# Patient Record
Sex: Female | Born: 1945
Health system: Southern US, Community
[De-identification: ages and names within clinical notes are randomized; demographics above are authoritative.]

## PROBLEM LIST (undated history)

## (undated) DIAGNOSIS — F419 Anxiety disorder, unspecified: Secondary | ICD-10-CM

## (undated) DIAGNOSIS — M8580 Other specified disorders of bone density and structure, unspecified site: Secondary | ICD-10-CM

## (undated) DIAGNOSIS — N390 Urinary tract infection, site not specified: Secondary | ICD-10-CM

## (undated) DIAGNOSIS — E559 Vitamin D deficiency, unspecified: Secondary | ICD-10-CM

## (undated) DIAGNOSIS — M1990 Unspecified osteoarthritis, unspecified site: Secondary | ICD-10-CM

## (undated) DIAGNOSIS — G8929 Other chronic pain: Secondary | ICD-10-CM

## (undated) DIAGNOSIS — I341 Nonrheumatic mitral (valve) prolapse: Secondary | ICD-10-CM

## (undated) DIAGNOSIS — K219 Gastro-esophageal reflux disease without esophagitis: Secondary | ICD-10-CM

## (undated) DIAGNOSIS — R319 Hematuria, unspecified: Secondary | ICD-10-CM

## (undated) DIAGNOSIS — M549 Dorsalgia, unspecified: Secondary | ICD-10-CM

## (undated) DIAGNOSIS — I10 Essential (primary) hypertension: Secondary | ICD-10-CM

## (undated) DIAGNOSIS — E7800 Pure hypercholesterolemia, unspecified: Secondary | ICD-10-CM

## (undated) HISTORY — DX: Other specified disorders of bone density and structure, unspecified site: M85.80

## (undated) HISTORY — DX: Anxiety disorder, unspecified: F41.9

## (undated) HISTORY — PX: KNEE SURGERY: SHX244

## (undated) HISTORY — DX: Gastro-esophageal reflux disease without esophagitis: K21.9

## (undated) HISTORY — DX: Dorsalgia, unspecified: M54.9

## (undated) HISTORY — PX: COLONOSCOPY: SHX174

## (undated) HISTORY — DX: Nonrheumatic mitral (valve) prolapse: I34.1

## (undated) HISTORY — PX: TUBAL LIGATION: SHX77

## (undated) HISTORY — DX: Other chronic pain: G89.29

## (undated) HISTORY — DX: Essential (primary) hypertension: I10

## (undated) HISTORY — DX: Hematuria, unspecified: R31.9

## (undated) HISTORY — DX: Unspecified osteoarthritis, unspecified site: M19.90

## (undated) HISTORY — DX: Vitamin D deficiency, unspecified: E55.9

## (undated) HISTORY — DX: Urinary tract infection, site not specified: N39.0

## (undated) HISTORY — DX: Pure hypercholesterolemia, unspecified: E78.00

## (undated) HISTORY — PX: APPENDECTOMY: SHX54

---

## 1998-05-10 ENCOUNTER — Ambulatory Visit (HOSPITAL_COMMUNITY): Admission: RE | Admit: 1998-05-10 | Discharge: 1998-05-10 | Payer: Self-pay | Admitting: *Deleted

## 1998-05-10 ENCOUNTER — Encounter: Payer: Self-pay | Admitting: *Deleted

## 1998-05-20 ENCOUNTER — Other Ambulatory Visit: Admission: RE | Admit: 1998-05-20 | Discharge: 1998-05-20 | Payer: Self-pay | Admitting: Obstetrics and Gynecology

## 1999-05-23 ENCOUNTER — Encounter: Admission: RE | Admit: 1999-05-23 | Discharge: 1999-05-23 | Payer: Self-pay | Admitting: *Deleted

## 1999-05-23 ENCOUNTER — Encounter: Payer: Self-pay | Admitting: *Deleted

## 1999-05-26 ENCOUNTER — Other Ambulatory Visit: Admission: RE | Admit: 1999-05-26 | Discharge: 1999-05-26 | Payer: Self-pay | Admitting: Obstetrics and Gynecology

## 2000-05-28 ENCOUNTER — Encounter: Admission: RE | Admit: 2000-05-28 | Discharge: 2000-05-28 | Payer: Self-pay | Admitting: *Deleted

## 2000-05-28 ENCOUNTER — Encounter: Payer: Self-pay | Admitting: *Deleted

## 2000-06-07 ENCOUNTER — Other Ambulatory Visit: Admission: RE | Admit: 2000-06-07 | Discharge: 2000-06-07 | Payer: Self-pay | Admitting: Obstetrics and Gynecology

## 2001-04-21 ENCOUNTER — Ambulatory Visit (HOSPITAL_COMMUNITY): Admission: RE | Admit: 2001-04-21 | Discharge: 2001-04-21 | Payer: Self-pay | Admitting: Family Medicine

## 2001-04-21 ENCOUNTER — Encounter: Payer: Self-pay | Admitting: Family Medicine

## 2001-06-28 ENCOUNTER — Encounter: Payer: Self-pay | Admitting: Obstetrics and Gynecology

## 2001-06-28 ENCOUNTER — Encounter: Admission: RE | Admit: 2001-06-28 | Discharge: 2001-06-28 | Payer: Self-pay | Admitting: Obstetrics and Gynecology

## 2001-07-04 ENCOUNTER — Other Ambulatory Visit: Admission: RE | Admit: 2001-07-04 | Discharge: 2001-07-04 | Payer: Self-pay | Admitting: Obstetrics and Gynecology

## 2002-07-17 ENCOUNTER — Encounter: Payer: Self-pay | Admitting: Obstetrics and Gynecology

## 2002-07-17 ENCOUNTER — Encounter: Admission: RE | Admit: 2002-07-17 | Discharge: 2002-07-17 | Payer: Self-pay | Admitting: Obstetrics and Gynecology

## 2002-07-19 ENCOUNTER — Encounter: Admission: RE | Admit: 2002-07-19 | Discharge: 2002-07-19 | Payer: Self-pay | Admitting: Obstetrics and Gynecology

## 2002-07-19 ENCOUNTER — Encounter: Payer: Self-pay | Admitting: Obstetrics and Gynecology

## 2003-01-15 ENCOUNTER — Encounter: Payer: Self-pay | Admitting: Family Medicine

## 2003-01-15 ENCOUNTER — Encounter: Admission: RE | Admit: 2003-01-15 | Discharge: 2003-01-15 | Payer: Self-pay | Admitting: Family Medicine

## 2003-07-23 ENCOUNTER — Encounter: Admission: RE | Admit: 2003-07-23 | Discharge: 2003-07-23 | Payer: Self-pay | Admitting: Family Medicine

## 2004-07-28 ENCOUNTER — Encounter: Admission: RE | Admit: 2004-07-28 | Discharge: 2004-07-28 | Payer: Self-pay | Admitting: Family Medicine

## 2005-07-31 ENCOUNTER — Encounter: Admission: RE | Admit: 2005-07-31 | Discharge: 2005-07-31 | Payer: Self-pay | Admitting: Family Medicine

## 2006-08-02 ENCOUNTER — Encounter: Admission: RE | Admit: 2006-08-02 | Discharge: 2006-08-02 | Payer: Self-pay | Admitting: Family Medicine

## 2006-09-22 ENCOUNTER — Ambulatory Visit (HOSPITAL_BASED_OUTPATIENT_CLINIC_OR_DEPARTMENT_OTHER): Admission: RE | Admit: 2006-09-22 | Discharge: 2006-09-22 | Payer: Self-pay | Admitting: Obstetrics and Gynecology

## 2006-09-22 ENCOUNTER — Encounter (INDEPENDENT_AMBULATORY_CARE_PROVIDER_SITE_OTHER): Payer: Self-pay | Admitting: *Deleted

## 2007-08-10 ENCOUNTER — Encounter: Admission: RE | Admit: 2007-08-10 | Discharge: 2007-08-10 | Payer: Self-pay | Admitting: Family Medicine

## 2007-08-18 ENCOUNTER — Encounter: Admission: RE | Admit: 2007-08-18 | Discharge: 2007-08-18 | Payer: Self-pay | Admitting: Obstetrics and Gynecology

## 2008-08-16 ENCOUNTER — Encounter: Admission: RE | Admit: 2008-08-16 | Discharge: 2008-08-16 | Payer: Self-pay | Admitting: Family Medicine

## 2009-08-20 ENCOUNTER — Encounter: Admission: RE | Admit: 2009-08-20 | Discharge: 2009-08-20 | Payer: Self-pay | Admitting: Family Medicine

## 2010-06-29 ENCOUNTER — Encounter: Payer: Self-pay | Admitting: Family Medicine

## 2010-08-05 ENCOUNTER — Other Ambulatory Visit: Payer: Self-pay | Admitting: Family Medicine

## 2010-08-05 DIAGNOSIS — Z1231 Encounter for screening mammogram for malignant neoplasm of breast: Secondary | ICD-10-CM

## 2010-08-26 ENCOUNTER — Ambulatory Visit
Admission: RE | Admit: 2010-08-26 | Discharge: 2010-08-26 | Disposition: A | Discharge status: Home / Self Care | Payer: BC Managed Care – PPO | Source: Ambulatory Visit | Attending: Family Medicine | Admitting: Family Medicine

## 2010-08-26 DIAGNOSIS — Z1231 Encounter for screening mammogram for malignant neoplasm of breast: Secondary | ICD-10-CM

## 2010-08-29 ENCOUNTER — Other Ambulatory Visit: Payer: Self-pay | Admitting: Family Medicine

## 2010-08-29 DIAGNOSIS — R928 Other abnormal and inconclusive findings on diagnostic imaging of breast: Secondary | ICD-10-CM

## 2010-09-04 ENCOUNTER — Ambulatory Visit
Admission: RE | Admit: 2010-09-04 | Discharge: 2010-09-04 | Disposition: A | Discharge status: Home / Self Care | Payer: BC Managed Care – PPO | Source: Ambulatory Visit | Attending: Family Medicine | Admitting: Family Medicine

## 2010-09-04 ENCOUNTER — Other Ambulatory Visit: Payer: Self-pay | Admitting: Family Medicine

## 2010-09-04 ENCOUNTER — Ambulatory Visit: Payer: BC Managed Care – PPO

## 2010-09-04 DIAGNOSIS — R928 Other abnormal and inconclusive findings on diagnostic imaging of breast: Secondary | ICD-10-CM

## 2010-10-24 NOTE — Op Note (Signed)
NAME:  CREECHJagger, Beahm                ACCOUNT NO.:  [id number]   MEDICAL RECORD NO.:  [id number]          PATIENT TYPE:  AMB   LOCATION:  [facility]                         FACILITY:  [facility]   PHYSICIAN:  Sherry A. Dickstein, M.D.DATE OF BIRTH:  1946-03-31   DATE OF PROCEDURE:  09/22/2006  DATE OF DISCHARGE:                               OPERATIVE REPORT   PREOPERATIVE DIAGNOSIS:  Endometrial polyp.   POSTOPERATIVE DIAGNOSES:  1. Endometrial polyp.  2. Cervical polyp.   PROCEDURE:  1. Dilation and curettage.  2. Hysteroscopy with resectoscopic resection of endometrial polyp.  3. Cervical polypectomy.   ANESTHESIA:  MAC.   INDICATIONS FOR PROCEDURE:  This is a 65 year old, G2, P2-0-0-2 woman  who has had endometrial polyps found on sono-hysterogram.  The patient  has had previous dilation and curettages in the past for polyps.  The  patient has a history of daily spotting for several years.  The patient  has been on hormone replacement therapy.  The patient has tried to stop  HRT in the past but had severe changes in her emotions and difficulty  sleeping.  The patient wants to stay on hormones, although she has  reduced the amount that she has been on.  The patient requests removal  of her polyp to stop this bleeding.   FINDINGS:  Normal sized, retroverted uterus, small cervical polyp  present, endometrial poly present.   PROCEDURE:  The patient was brought into the operating room and given  adequate IV sedation.  She was placed in the dorsal lithotomy position.  Her perineum was washed with Betadine.  A pelvic examination was  performed.  The surgeon's gown and gloves were changed.  The patient was  draped in a sterile fashion.  The speculum was placed in the vagina.  The vagina was washed with Betadine.  Paracervical block was  administered with 1% Nesacaine.   The anterior lip of the cervix was grasped with a single tooth  tenaculum.  The cervix was sounded.  The cervix was  dilated with [facility]  dilators, 2 and #31.  The hysteroscope was introduced into the  endometrial cavity.  Pictures were obtained.  Using a double loop right  angle resector, the endometrial polyp was removed separately.  There was  no other abnormality seen within the endometrial cavity.  Removing the  hysteroscope, a cervical polyp was identified and this was then removed  separately.  The separate endometrial curettings were obtained with  sharp curettage.  Scant tissue was obtained in this fashion.  Adequate  hemostasis was present.   All instruments were removed from the vagina.  The patient was taken out  of the dorsal lithotomy position and she was awaken.  She was removed  from the operating table to a stretcher in stable condition.  Complications were none.  Estimated blood loss was less than 5 mL.   SPECIMENS:  1. Cervical polyp.  2. Endometrial polyp  3. Endometrial curettings.      Sherry A. Rosalio Macadamia, M.D.  Electronically Signed     SAD/MEDQ  D:  09/22/2006  T:  09/22/2006  Job:  [id number]

## 2010-11-28 ENCOUNTER — Other Ambulatory Visit: Payer: Self-pay | Admitting: Obstetrics & Gynecology

## 2011-08-04 ENCOUNTER — Other Ambulatory Visit: Payer: Self-pay | Admitting: Family Medicine

## 2011-08-04 DIAGNOSIS — Z1231 Encounter for screening mammogram for malignant neoplasm of breast: Secondary | ICD-10-CM

## 2011-09-01 ENCOUNTER — Ambulatory Visit
Admission: RE | Admit: 2011-09-01 | Discharge: 2011-09-01 | Disposition: A | Discharge status: Home / Self Care | Payer: BC Managed Care – PPO | Source: Ambulatory Visit | Attending: Family Medicine | Admitting: Family Medicine

## 2011-09-01 DIAGNOSIS — Z1231 Encounter for screening mammogram for malignant neoplasm of breast: Secondary | ICD-10-CM

## 2012-08-02 ENCOUNTER — Other Ambulatory Visit: Payer: Self-pay | Admitting: Family Medicine

## 2012-08-02 DIAGNOSIS — Z1231 Encounter for screening mammogram for malignant neoplasm of breast: Secondary | ICD-10-CM

## 2012-09-02 ENCOUNTER — Ambulatory Visit
Admission: RE | Admit: 2012-09-02 | Discharge: 2012-09-02 | Disposition: A | Discharge status: Home / Self Care | Payer: Self-pay | Source: Ambulatory Visit | Attending: Family Medicine | Admitting: Family Medicine

## 2012-09-02 DIAGNOSIS — Z1231 Encounter for screening mammogram for malignant neoplasm of breast: Secondary | ICD-10-CM

## 2012-10-11 ENCOUNTER — Encounter: Payer: Self-pay | Admitting: Internal Medicine

## 2012-11-22 ENCOUNTER — Ambulatory Visit (AMBULATORY_SURGERY_CENTER): Payer: Medicare Other

## 2012-11-22 VITALS — Ht 61.5 in | Wt 125.0 lb

## 2012-11-22 DIAGNOSIS — Z1211 Encounter for screening for malignant neoplasm of colon: Secondary | ICD-10-CM

## 2012-11-22 MED ORDER — NA SULFATE-K SULFATE-MG SULF 17.5-3.13-1.6 GM/177ML PO SOLN: 1.0000 | Freq: Once | ORAL | Status: DC

## 2012-11-23 ENCOUNTER — Encounter: Payer: Self-pay | Admitting: Internal Medicine

## 2012-12-06 ENCOUNTER — Encounter: Payer: Medicare Other | Admitting: Internal Medicine

## 2012-12-12 ENCOUNTER — Encounter: Payer: Self-pay | Admitting: Internal Medicine

## 2012-12-12 ENCOUNTER — Ambulatory Visit (AMBULATORY_SURGERY_CENTER): Payer: Medicare Other | Admitting: Internal Medicine

## 2012-12-12 VITALS — BP 103/57 | HR 62 | Temp 96.8°F | Resp 17 | Ht 61.5 in | Wt 125.0 lb

## 2012-12-12 DIAGNOSIS — Z1211 Encounter for screening for malignant neoplasm of colon: Secondary | ICD-10-CM

## 2012-12-12 MED ORDER — SODIUM CHLORIDE 0.9 % IV SOLN: 500.0000 mL | INTRAVENOUS | Status: DC

## 2012-12-12 NOTE — Patient Instructions (Addendum)
The colonoscopy was normal and the prep was great!  Next routine colonoscopy in 2024 - about 10 years.  I appreciate the opportunity to care for you. Iva Boop, MD, [facility]   Discharge instructions given with verbal understanding. Normal exam. Resume previous medications. YOU HAD AN ENDOSCOPIC PROCEDURE TODAY AT THE [facility]: Refer to the procedure report that was given to you for any specific questions about what was found during the examination.  If the procedure report does not answer your questions, please call your gastroenterologist to clarify.  If you requested that your care partner not be given the details of your procedure findings, then the procedure report has been included in a sealed envelope for you to review at your convenience later.  YOU SHOULD EXPECT: Some feelings of bloating in the abdomen. Passage of more gas than usual.  Walking can help get rid of the air that was put into your GI tract during the procedure and reduce the bloating. If you had a lower endoscopy (such as a colonoscopy or flexible sigmoidoscopy) you may notice spotting of blood in your stool or on the toilet paper. If you underwent a bowel prep for your procedure, then you may not have a normal bowel movement for a few days.  DIET: Your first meal following the procedure should be a light meal and then it is ok to progress to your normal diet.  A half-sandwich or bowl of soup is an example of a good first meal.  Heavy or fried foods are harder to digest and may make you feel nauseous or bloated.  Likewise meals heavy in dairy and vegetables can cause extra gas to form and this can also increase the bloating.  Drink plenty of fluids but you should avoid alcoholic beverages for 24 hours.  ACTIVITY: Your care partner should take you home directly after the procedure.  You should plan to take it easy, moving slowly for the rest of the day.  You can resume normal activity the day after the  procedure however you should NOT DRIVE or use heavy machinery for 24 hours (because of the sedation medicines used during the test).    SYMPTOMS TO REPORT IMMEDIATELY: A gastroenterologist can be reached at any hour.  During normal business hours, 8:30 AM to 5:00 PM Monday through Friday, call [phone].  After hours and on weekends, please call the GI answering service at [phone] who will take a message and have the physician on call contact you.   Following lower endoscopy (colonoscopy or flexible sigmoidoscopy):  Excessive amounts of blood in the stool  Significant tenderness or worsening of abdominal pains  Swelling of the abdomen that is new, acute  Fever of 100F or higher  FOLLOW UP: If any biopsies were taken you will be contacted by phone or by letter within the next 1-3 weeks.  Call your gastroenterologist if you have not heard about the biopsies in 3 weeks.  Our staff will call the home number listed on your records the next business day following your procedure to check on you and address any questions or concerns that you may have at that time regarding the information given to you following your procedure. This is a courtesy call and so if there is no answer at the home number and we have not heard from you through the emergency physician on call, we will assume that you have returned to your regular daily activities without incident.  SIGNATURES/CONFIDENTIALITY: You and/or  your care partner have signed paperwork which will be entered into your electronic medical record.  These signatures attest to the fact that that the information above on your After Visit Summary has been reviewed and is understood.  Full responsibility of the confidentiality of this discharge information lies with you and/or your care-partner.

## 2012-12-12 NOTE — Op Note (Signed)
[facility] [address] [place], [address]   COLONOSCOPY PROCEDURE REPORT  PATIENT: Diane Villa, Diane Villa  MR#: [id number] BIRTHDATE: 04-21-1946 , 67  yrs. old GENDER: Female ENDOSCOPIST: Iva Boop, MD, [facility] PROCEDURE DATE:  12/12/2012 PROCEDURE:   Colonoscopy, screening  , last colonoscopy 2004 - 10 years ago  - no polyps ASA CLASS:   Class II INDICATIONS:Average risk patient for colorectal cancer. MEDICATIONS: propofol (Diprivan) 300mg  IV, MAC sedation, administered by CRNA, and These medications were titrated to patient response per physician's verbal order  DESCRIPTION OF PROCEDURE:   After the risks benefits and alternatives of the procedure were thoroughly explained, informed consent was obtained.  A digital rectal exam revealed no abnormalities of the rectum.   The LB [id number]  endoscope was introduced through the anus and advanced to the cecum, which was identified by both the appendix and ileocecal valve. No adverse events experienced.   The quality of the prep was excellent using Suprep  The instrument was then slowly withdrawn as the colon was fully examined.      COLON FINDINGS: A normal appearing cecum, ileocecal valve, and appendiceal orifice were identified.  The ascending, hepatic flexure, transverse, splenic flexure, descending, sigmoid colon and rectum appeared unremarkable.  No polyps or cancers were seen.   A right colon retroflexion was performed.  Retroflexed views revealed no abnormalities. The time to cecum=3 minutes 16 seconds. Withdrawal time=9 minutes 23 seconds.  The scope was withdrawn and the procedure completed. COMPLICATIONS: There were no complications.  ENDOSCOPIC IMPRESSION: Normal colonoscopy, excellent prep  RECOMMENDATIONS: Repeat Colonscopy in 10 years - 2024   eSigned:  Iva Boop, MD, [facility] 12/12/2012 9:08 AM   cc: Laurann Montana, MD and The Patient

## 2012-12-12 NOTE — Progress Notes (Signed)
Patient denies any eggs or soy allergies.

## 2012-12-12 NOTE — Progress Notes (Signed)
Patient did not experience any of the following events: a burn prior to discharge; a fall within the facility; wrong site/side/patient/procedure/implant event; or a hospital transfer or hospital admission upon discharge from the facility. (G8907) Patient did not have preoperative order for IV antibiotic SSI prophylaxis. (G8918)  

## 2012-12-13 ENCOUNTER — Telehealth: Payer: Self-pay | Admitting: *Deleted

## 2012-12-13 NOTE — Telephone Encounter (Signed)
Left message that we called for f/u 

## 2013-07-26 ENCOUNTER — Other Ambulatory Visit: Payer: Self-pay

## 2013-07-26 DIAGNOSIS — Z1231 Encounter for screening mammogram for malignant neoplasm of breast: Secondary | ICD-10-CM

## 2013-09-05 ENCOUNTER — Ambulatory Visit
Admission: RE | Admit: 2013-09-05 | Discharge: 2013-09-05 | Disposition: A | Discharge status: Home / Self Care | Payer: Medicare Other | Source: Ambulatory Visit

## 2013-09-05 DIAGNOSIS — Z1231 Encounter for screening mammogram for malignant neoplasm of breast: Secondary | ICD-10-CM

## 2014-03-21 ENCOUNTER — Ambulatory Visit (INDEPENDENT_AMBULATORY_CARE_PROVIDER_SITE_OTHER): Payer: Medicare Other

## 2014-03-21 ENCOUNTER — Encounter: Payer: Self-pay | Admitting: Podiatry

## 2014-03-21 ENCOUNTER — Ambulatory Visit (INDEPENDENT_AMBULATORY_CARE_PROVIDER_SITE_OTHER): Payer: Medicare Other | Admitting: Podiatry

## 2014-03-21 VITALS — BP 143/72 | HR 60 | Resp 12

## 2014-03-21 DIAGNOSIS — M2021 Hallux rigidus, right foot: Secondary | ICD-10-CM

## 2014-03-21 DIAGNOSIS — M779 Enthesopathy, unspecified: Secondary | ICD-10-CM

## 2014-03-21 DIAGNOSIS — M79672 Pain in left foot: Secondary | ICD-10-CM

## 2014-03-21 MED ORDER — TRIAMCINOLONE ACETONIDE 10 MG/ML IJ SUSP
10.0000 mg | Freq: Once | INTRAMUSCULAR | Status: AC
  Administered 2014-03-21: 10 mg

## 2014-03-21 NOTE — Progress Notes (Signed)
   Subjective:    Patient ID: Diane Villa, female    DOB: 1945-06-26, 68 y.o.   MRN: [id number]  HPI PT STATED LT TOP OF THE FOOT AND ARCH IS SORE AND SWOLLEN FOR 1 WEEK. THE FOOT IS NOT GETTING WORSE AND IT GET AGGRAVATED WHEN WALKING. TRIED NO TREATMENT.   Review of Systems  All other systems reviewed and are negative.      Objective:   Physical Exam        Assessment & Plan:

## 2014-03-22 NOTE — Progress Notes (Signed)
Subjective:     Patient ID: Diane Villa, female   DOB: [birth year], 68 y.o.   MRN: [id number]  HPI patient presents stating I'm getting a lot of pain in my forefoot left and my big toe joint right has remained a consistent irritant for me and did not respond to the medication of last year and have not been able to wear my orthotics as they do not fit my shoes properly   Review of Systems     Objective:   Physical Exam Neurovascular status intact with muscle strength adequate and I noted there to be discomfort around the anterior tibial tendon near its insertion into the base of the first metatarsal with moderate discomfort around the first MPJ right with no restriction of motion or crepitus upon movement    Assessment:     Acute tendinitis left with chronic structural hallux limitus deformity right    Plan:     H&P and x-rays reviewed. Today I did careful injection left 3 mg Kenalog 5 mg along the sheath of the tendon dispensed fascially brace and gave instructions on reduced activity supportive shoes. Scanned for new orthotics with a graphite extension on the right hallux and explained the procedure to the patient and the fact we may do a small injection around this when I see her back to dispensed orthotics reappoint if symptoms persist

## 2014-04-13 ENCOUNTER — Ambulatory Visit: Payer: Medicare Other

## 2014-04-13 DIAGNOSIS — M779 Enthesopathy, unspecified: Secondary | ICD-10-CM

## 2014-04-13 NOTE — Patient Instructions (Signed)

## 2014-04-13 NOTE — Progress Notes (Signed)
Pt is here to PUO 

## 2014-08-07 ENCOUNTER — Other Ambulatory Visit: Payer: Self-pay

## 2014-08-07 DIAGNOSIS — Z1231 Encounter for screening mammogram for malignant neoplasm of breast: Secondary | ICD-10-CM

## 2014-09-11 ENCOUNTER — Ambulatory Visit
Admission: RE | Admit: 2014-09-11 | Discharge: 2014-09-11 | Disposition: A | Discharge status: Home / Self Care | Payer: Medicare Other | Source: Ambulatory Visit

## 2014-09-11 DIAGNOSIS — Z1231 Encounter for screening mammogram for malignant neoplasm of breast: Secondary | ICD-10-CM

## 2015-08-05 ENCOUNTER — Other Ambulatory Visit: Payer: Self-pay

## 2015-08-05 DIAGNOSIS — Z1231 Encounter for screening mammogram for malignant neoplasm of breast: Secondary | ICD-10-CM

## 2015-09-17 ENCOUNTER — Ambulatory Visit
Admission: RE | Admit: 2015-09-17 | Discharge: 2015-09-17 | Disposition: A | Discharge status: Home / Self Care | Payer: Medicare Other | Source: Ambulatory Visit

## 2015-09-17 DIAGNOSIS — Z1231 Encounter for screening mammogram for malignant neoplasm of breast: Secondary | ICD-10-CM

## 2016-08-10 ENCOUNTER — Other Ambulatory Visit: Payer: Self-pay | Admitting: Family Medicine

## 2016-08-10 DIAGNOSIS — Z1231 Encounter for screening mammogram for malignant neoplasm of breast: Secondary | ICD-10-CM

## 2016-09-29 ENCOUNTER — Encounter (INDEPENDENT_AMBULATORY_CARE_PROVIDER_SITE_OTHER): Payer: Self-pay

## 2016-09-29 ENCOUNTER — Ambulatory Visit
Admission: RE | Admit: 2016-09-29 | Discharge: 2016-09-29 | Disposition: A | Discharge status: Home / Self Care | Payer: Medicare Other | Source: Ambulatory Visit | Attending: Family Medicine | Admitting: Family Medicine

## 2016-09-29 DIAGNOSIS — Z1231 Encounter for screening mammogram for malignant neoplasm of breast: Secondary | ICD-10-CM

## 2016-12-23 ENCOUNTER — Other Ambulatory Visit: Payer: Self-pay | Admitting: Dermatology

## 2017-07-28 ENCOUNTER — Encounter: Payer: Self-pay | Admitting: Podiatry

## 2017-07-28 ENCOUNTER — Ambulatory Visit: Payer: Medicare Other | Admitting: Podiatry

## 2017-07-28 ENCOUNTER — Ambulatory Visit (INDEPENDENT_AMBULATORY_CARE_PROVIDER_SITE_OTHER): Payer: Medicare Other

## 2017-07-28 ENCOUNTER — Other Ambulatory Visit: Payer: Self-pay | Admitting: Podiatry

## 2017-07-28 DIAGNOSIS — M205X1 Other deformities of toe(s) (acquired), right foot: Secondary | ICD-10-CM

## 2017-07-28 DIAGNOSIS — M779 Enthesopathy, unspecified: Secondary | ICD-10-CM | POA: Diagnosis not present

## 2017-07-28 DIAGNOSIS — M79671 Pain in right foot: Secondary | ICD-10-CM

## 2017-07-28 MED ORDER — TRIAMCINOLONE ACETONIDE 10 MG/ML IJ SUSP
10.0000 mg | Freq: Once | INTRAMUSCULAR | Status: AC
  Administered 2017-07-28: 10 mg

## 2017-07-28 NOTE — Progress Notes (Signed)
Subjective:   Patient ID: Diane BihariLinda H Villa, female   DOB: 72 y.o.   MRN: [id number]   HPI Patient states that got a little lump on top of my right ankle that really does not hurt but I do have a lot of pain in my big toe joint right and is been going on for several years and I like to play tennis and it makes it hard.  Patient has good digital perfusion well oriented x3 and does not smoke and likes to be active   Review of Systems  All other systems reviewed and are negative.       Objective:  Physical Exam  Constitutional: She appears well-developed and well-nourished.  Cardiovascular: Intact distal pulses.  Pulmonary/Chest: Effort normal.  Musculoskeletal: Normal range of motion.  Neurological: She is alert.  Skin: Skin is warm.  Nursing note and vitals reviewed.   Neurovascular status intact muscle strength was adequate range of motion adequate with what appears to be a small nodule in the right ankle around the anterior tib that is localized with no pain and has reduced range of motion of the first MPJ right with restriction and pain when I pressed the joint space itself.  Patient has good digital perfusion noted     Assessment:  Small lesion of the dorsal right foot which is probably a ganglionic cyst that is nonpainful and will be watched and if it were to grow in size or become painful it may require excision and discussed hallux limitus rigidus condition with patient     Plan:  H&P x-rays reviewed and injected around the capsule of the first MPJ 3 mg Kenalog 5 mg Xylocaine and I recommended a new orthotic and referred her to ped orthotist.  I do think either a Morton's extension or even a reverse Morton's take pressure off the first metatarsal would be good and we may go ahead and rehabilitate the graphite orthotic she is using currently  X-rays indicate spur formation with no indications of stress fracture and narrowing of the joint surface right over left

## 2017-07-28 NOTE — Progress Notes (Signed)
   Subjective:    Patient ID: Diane BihariLinda H Villa, female    DOB: Oct 25, 1945, 72 y.o.   MRN: [id number]  HPI    Review of Systems  All other systems reviewed and are negative.      Objective:   Physical Exam        Assessment & Plan:

## 2017-08-03 ENCOUNTER — Ambulatory Visit (INDEPENDENT_AMBULATORY_CARE_PROVIDER_SITE_OTHER): Payer: Self-pay | Admitting: Orthotics

## 2017-08-03 DIAGNOSIS — M7750 Other enthesopathy of unspecified foot: Secondary | ICD-10-CM

## 2017-08-03 DIAGNOSIS — M79671 Pain in right foot: Secondary | ICD-10-CM

## 2017-08-03 DIAGNOSIS — M205X1 Other deformities of toe(s) (acquired), right foot: Secondary | ICD-10-CM

## 2017-08-03 DIAGNOSIS — M779 Enthesopathy, unspecified: Secondary | ICD-10-CM

## 2017-08-03 NOTE — Progress Notes (Signed)
Patient is here today to be evaluated and cast for CMFO.  Patient has hx of functional hallux limitus (FHL), and needs a supportive orthoses that will plantarflex first ray in order to lower hinge pin of first MPJ and enhance windless effect.  Plan of deep heel cup, hug arch, and reverse mortons extension.  Richy to fab.  

## 2017-08-20 ENCOUNTER — Other Ambulatory Visit: Payer: Self-pay | Admitting: Family Medicine

## 2017-08-20 DIAGNOSIS — Z1231 Encounter for screening mammogram for malignant neoplasm of breast: Secondary | ICD-10-CM

## 2017-08-24 ENCOUNTER — Ambulatory Visit: Payer: Medicare Other | Admitting: Orthotics

## 2017-08-24 DIAGNOSIS — M779 Enthesopathy, unspecified: Secondary | ICD-10-CM

## 2017-08-24 DIAGNOSIS — M205X1 Other deformities of toe(s) (acquired), right foot: Secondary | ICD-10-CM

## 2017-08-24 NOTE — Progress Notes (Signed)
Patient came in today to pick up custom made foot orthotics.  The goals were accomplished and the patient reported no dissatisfaction with said orthotics.  Patient was advised of breakin period and how to report any issues. 

## 2017-09-20 ENCOUNTER — Encounter: Payer: Self-pay | Admitting: Emergency Medicine

## 2017-09-20 ENCOUNTER — Other Ambulatory Visit: Payer: Self-pay | Admitting: Emergency Medicine

## 2017-09-21 ENCOUNTER — Ambulatory Visit: Payer: Medicare Other | Admitting: Physician Assistant

## 2017-09-21 ENCOUNTER — Encounter: Payer: Self-pay | Admitting: Physician Assistant

## 2017-09-21 ENCOUNTER — Other Ambulatory Visit: Payer: Self-pay | Admitting: Physician Assistant

## 2017-09-21 VITALS — BP 150/78 | HR 60 | Ht 61.5 in | Wt 126.0 lb

## 2017-09-21 DIAGNOSIS — K449 Diaphragmatic hernia without obstruction or gangrene: Secondary | ICD-10-CM | POA: Diagnosis not present

## 2017-09-21 DIAGNOSIS — R1013 Epigastric pain: Secondary | ICD-10-CM | POA: Diagnosis not present

## 2017-09-21 MED ORDER — PANTOPRAZOLE SODIUM 40 MG PO TBEC: 40.0000 mg | DELAYED_RELEASE_TABLET | Freq: Every day | ORAL | 1 refills | Status: DC

## 2017-09-21 NOTE — Patient Instructions (Addendum)
We have sent the following medications to your pharmacy for you to pick up at your convenience: Pantoprazole 40 mg daily for 1 month 30-60 minutes before breakfast.

## 2017-09-21 NOTE — Progress Notes (Addendum)
Chief Complaint: Epigastric pain  HPI:    Diane Villa is a 72 year old female with a past medical history as listed below, who follows with Dr. Leone Payor and who was referred to me by Laurann Montana, MD for a complaint of epigastric pain.      08/24/17 CBC normal, CMP normal, lipase and amylase normal.  Per referring physician's notes patient described a left upper quadrant abdominal pain. 07/21/02 EGD with gastritis and hiatal hernia.  12/12/12 colonoscopy with excellent prep, repeat recommended in 10 years.    Today, patient tells me that she had very similar episodes of epigastric pain prior to having her first EGD in 2004.  At that time patient tells me she was started on a medication, but "I do not like medicines so I did not take this".  Over the past 15 years she has been having episodes of epigastric pain which will bother her for a few days, rated as a 5/10 and then will go away.  A couple of months ago these episodes started coming and lasting for longer and hurting just a little bit more.  Started on Omeprazole 40 mg daily by her PCP.  She "read all of the side effects" and had an increase in dizziness about 24 hours after starting this medication.  Took it daily for 12 days, helped a little bit but did not completely relieve her symptoms.  Diizziness increased to the point where "it was scaring me".  Patient discontinued this medication and her dizziness went away within 48 hours.      Since then she has continued with a 5/10 epigastric pain which sometimes feels better in different positions.  Was at the dentist laid back in the chair the other day and had complete relief of her pain for about 24 hours after.  Also describes different exercises which seem to help including stretches.  Patient questions if this could be her hiatal hernia.  She denies any overt heartburn or reflux symptoms.    Admits to being a very active person, exercises 3 days a week and plays tennis.  Does describe being  somewhat anxious if she has a "symptom that I cannot explain".    Denies fever, chills, blood in her stool, melena, weight loss, anorexia, nausea, vomiting or symptoms that awaken her at night.  Past Medical History:  Diagnosis Date  . Anxiety   . Chronic back pain   . GERD (gastroesophageal reflux disease)   . Hematuria   . Hypercholesteremia   . Hypertension   . Mitral valve prolapse   . Osteoarthritis    right great toe  . Osteopenia   . Vitamin D deficiency     Past Surgical History:  Procedure Laterality Date  . APPENDECTOMY    . COLONOSCOPY    . TUBAL LIGATION      Current Outpatient Medications  Medication Sig Dispense Refill  . amLODipine (NORVASC) 2.5 MG tablet Take 2.5 mg by mouth daily.    Marland Kitchen aspirin 81 MG tablet Take 81 mg by mouth daily.    Marland Kitchen atenolol (TENORMIN) 100 MG tablet Take 100 mg by mouth daily.    . cholecalciferol (VITAMIN D) 1000 UNITS tablet Take 1,000 Units by mouth daily.    Marland Kitchen losartan (COZAAR) 100 MG tablet Take 100 mg by mouth daily.    . pantoprazole (PROTONIX) 40 MG tablet Take 1 tablet (40 mg total) by mouth daily. 30 tablet 1   No current facility-administered medications for this visit.  Allergies as of 09/21/2017 - Review Complete 09/21/2017  Allergen Reaction Noted  . Ambien [zolpidem tartrate]  09/20/2017  . Sulfur  11/22/2012    Family History  Problem Relation Age of Onset  . Diabetes Mother   . Heart failure Mother   . Other Father        killled in an accident  . Heart disease Sister   . Stroke Maternal Grandmother   . Colon cancer Neg Hx   . Breast cancer Neg Hx   . Stomach cancer Neg Hx   . Liver cancer Neg Hx     Social History   Socioeconomic History  . Marital status: Widowed    Spouse name: Not on file  . Number of children: 2  . Years of education: Not on file  . Highest education level: Not on file  Occupational History  . Occupation: retired    Comment: accounts payable  Social Needs  . Financial  resource strain: Not on file  . Food insecurity:    Worry: Not on file    Inability: Not on file  . Transportation needs:    Medical: Not on file    Non-medical: Not on file  Tobacco Use  . Smoking status: Never Smoker  . Smokeless tobacco: Never Used  Substance and Sexual Activity  . Alcohol use: No  . Drug use: No  . Sexual activity: Not on file  Lifestyle  . Physical activity:    Days per week: Not on file    Minutes per session: Not on file  . Stress: Not on file  Relationships  . Social connections:    Talks on phone: Not on file    Gets together: Not on file    Attends religious service: Not on file    Active member of club or organization: Not on file    Attends meetings of clubs or organizations: Not on file    Relationship status: Not on file  . Intimate partner violence:    Fear of current or ex partner: Not on file    Emotionally abused: Not on file    Physically abused: Not on file    Forced sexual activity: Not on file  Other Topics Concern  . Not on file  Social History Narrative  . Not on file    Review of Systems:    Constitutional: No weight loss, fever or chills Skin: No rash Cardiovascular: No chest pain Respiratory: No SOB  Gastrointestinal: See HPI and otherwise negative Genitourinary: No dysuria  Neurological: No headache Musculoskeletal: No new muscle or joint pain Hematologic: No bleeding  Psychiatric: No history of depression or anxiety   Physical Exam:  Vital signs: BP (!) 150/78   Pulse 60   Ht 5' 1.5" (1.562 m)   Wt 126 lb (57.2 kg)   BMI 23.42 kg/m   Constitutional:   Pleasant Elderly Caucasian female appears to be in NAD, Well developed, Well nourished, alert and cooperative Head:  Normocephalic and atraumatic. Eyes:   PEERL, EOMI. No icterus. Conjunctiva pink. Ears:  Normal auditory acuity. Neck:  Supple Throat: Oral cavity and pharynx without inflammation, swelling or lesion.  Respiratory: Respirations even and  unlabored. Lungs clear to auscultation bilaterally.   No wheezes, crackles, or rhonchi.  Cardiovascular: Normal S1, S2. No MRG. Regular rate and rhythm. No peripheral edema, cyanosis or pallor.  Gastrointestinal:  Soft, nondistended, mild epigastric ttp, No rebound or guarding. Normal bowel sounds. No appreciable masses or hepatomegaly. Rectal:  Not  performed.  Msk:  Symmetrical without gross deformities. Without edema, no deformity or joint abnormality.  Neurologic:  Alert and  oriented x4;  grossly normal neurologically.  Skin:   Dry and intact without significant lesions or rashes. Psychiatric:  Demonstrates good judgement and reason without abnormal affect or behaviors.  See HPI for recent labs.  Assessment: 1.  Epigastric pain: 5/10, worse over the past 3 months, some better with Omeprazole 40 mg for 12 days, EGD in 2004 with gastritis and hiatal hernia; consider esophagitis/gastritis/hiatal hernia/GERD, likely worsened by anxiety 2.  Hiatal hernia: See above  Plan: 1.  Discussed with patient that it could be her hiatal hernia which is giving her pain, though this can be related with increased reflux/esophagitis. 2.  Started patient on Pantoprazole 40 mg daily which she was instructed to take daily 30-60 minutes before dinner for a full 30 days.  (We tried Pantoprazole as patient described dizziness with Omeprazole) 3.  Discussed with patient that she could try some hiatal hernia maneuvers/massaging if she would like. 4.  Patient will follow with me in 4 weeks.  If not better, would recommend repeat EGD versus possible upper GI study for further evaluation.  Diane Meeker, PA-C Diane Villa 09/21/2017, 11:38 AM   Agree with Diane Villa evaluation and management.  Sounds very much like GERD overall Her hiatal hernia was 1 cm so unlikely a major issue though can be part of GERD My 2004 note said she had done well on pantoprazole Would not do an upper GI series - suspect  she will be better on PPI and not need an EGD but we will see   Wt Readings from Last 3 Encounters:  09/21/17 126 lb (57.2 kg)  12/12/12 125 lb (56.7 kg)  11/22/12 125 lb (56.7 kg)    Diane Boop, MD, [facility]     Cc: Laurann Montana, MD

## 2017-09-30 ENCOUNTER — Ambulatory Visit
Admission: RE | Admit: 2017-09-30 | Discharge: 2017-09-30 | Disposition: A | Discharge status: Home / Self Care | Payer: Medicare Other | Source: Ambulatory Visit | Attending: Family Medicine | Admitting: Family Medicine

## 2017-09-30 DIAGNOSIS — Z1231 Encounter for screening mammogram for malignant neoplasm of breast: Secondary | ICD-10-CM

## 2017-10-26 ENCOUNTER — Ambulatory Visit: Payer: Medicare Other | Admitting: Physician Assistant

## 2017-10-26 ENCOUNTER — Encounter: Payer: Self-pay | Admitting: Physician Assistant

## 2017-10-26 VITALS — BP 132/78 | HR 56 | Ht 61.5 in | Wt 125.6 lb

## 2017-10-26 DIAGNOSIS — R1013 Epigastric pain: Secondary | ICD-10-CM

## 2017-10-26 NOTE — Progress Notes (Addendum)
Chief Complaint: Epigastric pain  HPI:     Mrs. Diane Villa is a 72 year old Caucasian female with a past medical history as listed below, follows with Dr. Leone Payor and presents to clinic today for follow-up of her epigastric pain.    09/21/2017 office visit describing episodes of epigastric pain 5/10 which did bother her for a few days and go away.  It started increasing in frequency.  Describes dizziness with omeprazole 40 mg daily.  Describes having relief of pain when laying back in her dental chair.  Patient was started on pantoprazole 40 mg daily.  We also discussed maneuvers for her hiatal hernia/massaging.  It was discussed that she is no better in 4 weeks would recommend a repeat EGD.    Today, describes that she took her Pantoprazole 40 mg daily for about 11 days, she went to sleep on a Friday night about 2 weeks ago feeling sort of "yucky" and when she woke up all of her symptoms were gone.  Patient tells me this tends to be the way with this epigastric pain and anxiety, that it just comes and goes.  She feels that it is something due to a hormone imbalance in her body.  She is no longer taking the medication and feels perfectly fine.    Denies fever, chills, blood in her stool, change in bowel habits or weight loss.  Past Medical History:  Diagnosis Date  . Anxiety   . Chronic back pain   . GERD (gastroesophageal reflux disease)   . Hematuria   . Hypercholesteremia   . Hypertension   . Mitral valve prolapse   . Osteoarthritis    right great toe  . Osteopenia   . Vitamin D deficiency     Past Surgical History:  Procedure Laterality Date  . APPENDECTOMY    . COLONOSCOPY    . TUBAL LIGATION      Current Outpatient Medications  Medication Sig Dispense Refill  . amLODipine (NORVASC) 2.5 MG tablet Take 2.5 mg by mouth daily.    Marland Kitchen aspirin 81 MG tablet Take 81 mg by mouth daily.    Marland Kitchen atenolol (TENORMIN) 100 MG tablet Take 100 mg by mouth daily.    . cholecalciferol (VITAMIN D)  1000 UNITS tablet Take 1,000 Units by mouth daily.    Marland Kitchen losartan (COZAAR) 100 MG tablet Take 100 mg by mouth daily.    . pantoprazole (PROTONIX) 40 MG tablet TAKE 1 TABLET(40 MG) BY MOUTH DAILY 90 tablet 1   No current facility-administered medications for this visit.     Allergies as of 10/26/2017 - Review Complete 09/21/2017  Allergen Reaction Noted  . Ambien [zolpidem tartrate]  09/20/2017  . Sulfur  11/22/2012    Family History  Problem Relation Age of Onset  . Diabetes Mother   . Heart failure Mother   . Other Father        killled in an accident  . Heart disease Sister   . Stroke Maternal Grandmother   . Colon cancer Neg Hx   . Breast cancer Neg Hx   . Stomach cancer Neg Hx   . Liver cancer Neg Hx     Social History   Socioeconomic History  . Marital status: Widowed    Spouse name: Not on file  . Number of children: 2  . Years of education: Not on file  . Highest education level: Not on file  Occupational History  . Occupation: retired    Comment: accounts payable  Social  Needs  . Financial resource strain: Not on file  . Food insecurity:    Worry: Not on file    Inability: Not on file  . Transportation needs:    Medical: Not on file    Non-medical: Not on file  Tobacco Use  . Smoking status: Never Smoker  . Smokeless tobacco: Never Used  Substance and Sexual Activity  . Alcohol use: No  . Drug use: No  . Sexual activity: Not on file  Lifestyle  . Physical activity:    Days per week: Not on file    Minutes per session: Not on file  . Stress: Not on file  Relationships  . Social connections:    Talks on phone: Not on file    Gets together: Not on file    Attends religious service: Not on file    Active member of club or organization: Not on file    Attends meetings of clubs or organizations: Not on file    Relationship status: Not on file  . Intimate partner violence:    Fear of current or ex partner: Not on file    Emotionally abused: Not on  file    Physically abused: Not on file    Forced sexual activity: Not on file  Other Topics Concern  . Not on file  Social History Narrative  . Not on file    Review of Systems:    Constitutional: No weight loss, fever or chills Cardiovascular: No chest pain  Respiratory: No SOB Gastrointestinal: See HPI and otherwise negative   Physical Exam:  Vital signs: BP 132/78   Pulse (!) 56   Ht 5' 1.5" (1.562 m)   Wt 125 lb 9.6 oz (57 kg)   SpO2 98%   BMI 23.35 kg/m   Constitutional:   Pleasant Caucasian female appears to be in NAD, Well developed, Well nourished, alert and cooperative Respiratory: Respirations even and unlabored. Lungs clear to auscultation bilaterally.   No wheezes, crackles, or rhonchi.  Cardiovascular: Normal S1, S2. No MRG. Regular rate and rhythm. No peripheral edema, cyanosis or pallor.  Gastrointestinal:  Soft, nondistended, nontender. No rebound or guarding. Normal bowel sounds. No appreciable masses or hepatomegaly. Psychiatric: Demonstrates good judgement and reason without abnormal affect or behaviors.  No recent labs or imaging.  Assessment: 1.  Epigastric pain: Used pantoprazole 40 mg for 11 days and pain and anxiety are gone  Plan: 1.  Patient to follow in clinic as needed in the future with Dr. Leone Payor or myself.  Hyacinth Meeker, PA-C [facility] 10/26/2017, 10:13 AM   Agree with Ms. Lenard Simmer evaluation and management.  Iva Boop, MD, [facility]   Cc: Laurann Montana, MD

## 2017-11-30 ENCOUNTER — Other Ambulatory Visit: Payer: Self-pay | Admitting: Dermatology

## 2018-03-21 ENCOUNTER — Ambulatory Visit: Payer: Medicare Other | Admitting: Podiatry

## 2018-03-21 ENCOUNTER — Encounter: Payer: Self-pay | Admitting: Podiatry

## 2018-03-21 ENCOUNTER — Ambulatory Visit (INDEPENDENT_AMBULATORY_CARE_PROVIDER_SITE_OTHER): Payer: Medicare Other

## 2018-03-21 ENCOUNTER — Other Ambulatory Visit: Payer: Self-pay | Admitting: Podiatry

## 2018-03-21 DIAGNOSIS — M779 Enthesopathy, unspecified: Secondary | ICD-10-CM | POA: Diagnosis not present

## 2018-03-21 DIAGNOSIS — M205X1 Other deformities of toe(s) (acquired), right foot: Secondary | ICD-10-CM | POA: Diagnosis not present

## 2018-03-21 DIAGNOSIS — L723 Sebaceous cyst: Secondary | ICD-10-CM | POA: Diagnosis not present

## 2018-03-21 DIAGNOSIS — M205X2 Other deformities of toe(s) (acquired), left foot: Secondary | ICD-10-CM

## 2018-03-21 DIAGNOSIS — M79672 Pain in left foot: Secondary | ICD-10-CM

## 2018-03-21 MED ORDER — TRIAMCINOLONE ACETONIDE 10 MG/ML IJ SUSP
10.0000 mg | Freq: Once | INTRAMUSCULAR | Status: AC
  Administered 2018-03-21: 10 mg

## 2018-03-23 NOTE — Progress Notes (Signed)
Subjective:   Patient ID: Diane Villa, female   DOB: 72 y.o.   MRN: [id number]   HPI Patient presents stating the big toe joint on the right foot has become increasingly tender and it did help a lot with the previous injection was given   ROS      Objective:  Physical Exam  Neurovascular status intact with patient noted to have hallux limitus deformity bilateral and inflammation pain was fluid buildup around the first MPJ right     Assessment:  Capsulitis of the first MPJ right with patient having structural hallux limitus deformity bilateral     Plan:  H&P and x-rays reviewed with patient today.  At this point we did discuss possibility for osteotomy in the future overfocus conservatively and I did go ahead and I reinjected around the first MPJ into the articular surface 3 mg Kenalog 5 Milgram Xylocaine and will reevaluate when symptomatic again.  Patient again may end up requiring a different type of procedure depending on response

## 2018-08-18 ENCOUNTER — Other Ambulatory Visit: Payer: Self-pay | Admitting: Family Medicine

## 2018-08-18 DIAGNOSIS — Z1231 Encounter for screening mammogram for malignant neoplasm of breast: Secondary | ICD-10-CM

## 2018-09-30 ENCOUNTER — Encounter (HOSPITAL_COMMUNITY): Payer: Self-pay

## 2018-09-30 ENCOUNTER — Emergency Department (HOSPITAL_COMMUNITY)
Admission: EM | Admit: 2018-09-30 | Discharge: 2018-10-01 | Disposition: A | Discharge status: Home / Self Care | Payer: Medicare Other | Attending: Emergency Medicine | Admitting: Emergency Medicine

## 2018-09-30 ENCOUNTER — Other Ambulatory Visit: Payer: Self-pay

## 2018-09-30 DIAGNOSIS — R310 Gross hematuria: Secondary | ICD-10-CM | POA: Insufficient documentation

## 2018-09-30 DIAGNOSIS — R799 Abnormal finding of blood chemistry, unspecified: Secondary | ICD-10-CM | POA: Diagnosis present

## 2018-09-30 DIAGNOSIS — I10 Essential (primary) hypertension: Secondary | ICD-10-CM | POA: Diagnosis not present

## 2018-09-30 DIAGNOSIS — Z79899 Other long term (current) drug therapy: Secondary | ICD-10-CM | POA: Insufficient documentation

## 2018-09-30 LAB — URINALYSIS, ROUTINE W REFLEX MICROSCOPIC
Bilirubin Urine: NEGATIVE
Glucose, UA: NEGATIVE mg/dL
Ketones, ur: NEGATIVE mg/dL
Leukocytes,Ua: NEGATIVE
Nitrite: NEGATIVE
Protein, ur: 100 mg/dL — AB
RBC / HPF: >50 RBC/hpf — ABNORMAL HIGH (ref 0–5)
Specific Gravity, Urine: 1.018 (ref 1.005–1.030)
pH: 5 (ref 5.0–8.0)

## 2018-09-30 LAB — BASIC METABOLIC PANEL
Anion gap: 9 (ref 5–15)
BUN: 18 mg/dL (ref 8–23)
CO2: 24 mmol/L (ref 22–32)
Calcium: 9.1 mg/dL (ref 8.9–10.3)
Chloride: 107 mmol/L (ref 98–111)
Creatinine, Ser: 0.97 mg/dL (ref 0.44–1.00)
GFR calc Af Amer: >60 mL/min (ref >60)
GFR calc non Af Amer: 58 mL/min — ABNORMAL LOW (ref >60)
Glucose, Bld: 190 mg/dL — ABNORMAL HIGH (ref 70–99)
Potassium: 4.1 mmol/L (ref 3.5–5.1)
Sodium: 140 mmol/L (ref 135–145)

## 2018-09-30 LAB — CBC
HCT: 45.6 % (ref 36.0–46.0)
Hemoglobin: 14.9 g/dL (ref 12.0–15.0)
MCH: 29.9 pg (ref 26.0–34.0)
MCHC: 32.7 g/dL (ref 30.0–36.0)
MCV: 91.4 fL (ref 80.0–100.0)
Platelets: 176 10*3/uL (ref 150–400)
RBC: 4.99 MIL/uL (ref 3.87–5.11)
RDW: 12.6 % (ref 11.5–15.5)
WBC: 6.1 10*3/uL (ref 4.0–10.5)
nRBC: 0 % (ref 0.0–0.2)

## 2018-09-30 NOTE — ED Triage Notes (Signed)
Pt here from home after PCP called and told her to come for a CT scan.  Pt had urine tested today and had blood and proteins in urine.  No urinary symptoms.  A&Ox4.  Paperwork from [employer]

## 2018-10-01 NOTE — ED Provider Notes (Signed)
[facility] EMERGENCY DEPARTMENT Provider Note   CSN: [id number] Arrival date & time: 09/30/18  1943    History   Chief Complaint Chief Complaint  Patient presents with  . Abnormal Lab    HPI Diane Villa is a 73 y.o. female.     The history is provided by the patient.  Abnormal Lab  Time since result:  Hematuria and Proteinuria Patient referred by:  PCP Patient presents from referral from the walk-in clinic for hematuria/proteinuria.  Onset was over 12 hours ago, and is improving.  Nothing worsens her symptoms.  She denies fever/vomiting.  No dysuria or frequency.  No back pain. She noted this at home, tried to see her PCP and was referred to the walk-in clinic when she went to the walk-in clinic they told her she should go to ER for further testing. She denies any history of urologic surgery.  She does report postmenopausal atrophic vaginitis that will cause itching and burning at times  Past Medical History:  Diagnosis Date  . Anxiety   . Chronic back pain   . GERD (gastroesophageal reflux disease)   . Hematuria   . Hypercholesteremia   . Hypertension   . Mitral valve prolapse   . Osteoarthritis    right great toe  . Osteopenia   . Vitamin D deficiency     There are no active problems to display for this patient.   Past Surgical History:  Procedure Laterality Date  . APPENDECTOMY    . COLONOSCOPY    . TUBAL LIGATION       OB History   No obstetric history on file.      Home Medications    Prior to Admission medications   Medication Sig Start Date End Date Taking? Authorizing Provider  ALPRAZolam (XANAX) 0.5 MG tablet TK 1/2 T PO TID PRA 02/01/18   [provider]  amLODipine (NORVASC) 2.5 MG tablet Take 2.5 mg by mouth daily.    [provider]  aspirin 81 MG tablet Take 81 mg by mouth daily.    [provider]  atenolol (TENORMIN) 100 MG tablet Take 100 mg by mouth daily.    [provider]   cholecalciferol (VITAMIN D) 1000 UNITS tablet Take 1,000 Units by mouth daily.    [provider]  losartan (COZAAR) 100 MG tablet Take 100 mg by mouth daily.    [provider]    Family History Family History  Problem Relation Age of Onset  . Diabetes Mother   . Heart failure Mother   . Other Father        killled in an accident  . Heart disease Sister   . Stroke Maternal Grandmother   . Colon cancer Neg Hx   . Breast cancer Neg Hx   . Stomach cancer Neg Hx   . Liver cancer Neg Hx     Social History Social History   Tobacco Use  . Smoking status: Never Smoker  . Smokeless tobacco: Never Used  Substance Use Topics  . Alcohol use: No  . Drug use: No     Allergies   Ambien [zolpidem tartrate] and Sulfur   Review of Systems Review of Systems  Constitutional: Negative for fever.  Gastrointestinal: Negative for abdominal pain and vomiting.  Genitourinary: Positive for hematuria. Negative for decreased urine volume, difficulty urinating, dysuria, flank pain, frequency, urgency, vaginal bleeding and vaginal pain.  Psychiatric/Behavioral: The patient is nervous/anxious.   All other systems reviewed  and are negative.    Physical Exam Updated Vital Signs BP (!) 179/109   Pulse 73   Temp 98.3 F (36.8 C) (Oral)   Resp 18   SpO2 99%   Physical Exam CONSTITUTIONAL: Well developed/well nourished HEAD: Normocephalic/atraumatic EYES: EOMI/PERRL ENMT: Mucous membranes moist NECK: supple no meningeal signs SPINE/BACK:entire spine nontender CV: S1/S2 noted, no murmurs/rubs/gallops noted LUNGS: Lungs are clear to auscultation bilaterally, no apparent distress ABDOMEN: soft, nontender, no rebound or guarding, bowel sounds noted throughout abdomen GU:no cva tenderness NEURO: Pt is awake/alert/appropriate, moves all extremitiesx4.  No facial droop.   EXTREMITIES: pulses normal/equal, full ROM SKIN: warm, color normal PSYCH: no abnormalities of mood  noted, alert and oriented to situation   ED Treatments / Results  Labs (all labs ordered are listed, but only abnormal results are displayed) Labs Reviewed  BASIC METABOLIC PANEL - Abnormal; Notable for the following components:      Result Value   Glucose, Bld 190 (*)    GFR calc non Af Amer 58 (*)    All other components within normal limits  URINALYSIS, ROUTINE W REFLEX MICROSCOPIC - Abnormal; Notable for the following components:   APPearance CLOUDY (*)    Hgb urine dipstick LARGE (*)    Protein, ur 100 (*)    RBC / HPF >50 (*)    Bacteria, UA RARE (*)    All other components within normal limits  URINE CULTURE  CBC    EKG None  Radiology No results found.  Procedures Procedures (including critical care time)  Medications Ordered in ED Medications - No data to display   Initial Impression / Assessment and Plan / ED Course  I have reviewed the triage vital signs and the nursing notes.  Pertinent labs  results that were available during my care of the patient were reviewed by me and considered in my medical decision making (see chart for details).        Pt is very well-appearing.  She does have hematuria and proteinuria, but she denies any pain at this time.  She is well-appearing.  No fever. Will send urine for culture, but will defer antibiotics for now She reports that her symptoms already seem to be improving. At this point I do not feel emergent imaging is required.  Patient is agreeable with plan.  Discussed with family via phone at patient request She will follow with PCP next 7 to 10 days for repeat urinalysis.  At that point if it continues she may need outpatient work-up of potential urology referral.  Advised her that hematuria could indicate bladder tumor, this may need to be evaluated by urology  Final Clinical Impressions(s) / ED Diagnoses   Final diagnoses:  Gross hematuria    ED Discharge Orders    None       Zadie RhineWickline, Livian Vanderbeck, MD  10/01/18 [phone]

## 2018-10-01 NOTE — Discharge Instructions (Addendum)
Be sure to stay hydrated Followup in a week for repeat urine testing

## 2018-10-01 NOTE — ED Notes (Signed)
Patient verbalizes understanding of discharge instructions. Opportunity for questioning and answers were provided. Armband removed by staff, pt discharged from ED.  

## 2018-10-03 ENCOUNTER — Ambulatory Visit: Payer: Medicare Other

## 2018-10-03 LAB — URINE CULTURE: Culture: >=100000 — AB

## 2018-10-04 ENCOUNTER — Telehealth: Payer: Self-pay | Admitting: Emergency Medicine

## 2018-10-04 NOTE — Telephone Encounter (Signed)
Post ED Visit - Positive Culture Follow-up: Successful Patient Follow-Up  Culture assessed and recommendations reviewed by:  []  Enzo Bi, Pharm.D. []  Celedonio Miyamoto, Pharm.D., BCPS AQ-ID []  Garvin Fila, Pharm.D., BCPS []  Georgina Pillion, [address].D., BCPS []  [place], [address].D., BCPS, AAHIVP []  Estella Husk, Pharm.D., BCPS, AAHIVP [x]  Lysle Pearl, PharmD, BCPS []  Phillips Climes, PharmD, BCPS []  Agapito Games, PharmD, BCPS []  Verlan Friends, PharmD  Positive urine culture  [x]  Patient discharged without antimicrobial prescription and treatment is now indicated []  Organism is resistant to prescribed ED discharge antimicrobial []  Patient with positive blood cultures  Changes discussed with ED provider: [name] PA New antibiotic prescription symptom check, if + start Cephalexin 500mg  po bid x 5 days Patient states had symptoms and called her PCP and was rx ampicillin on 10/03/2018 and has noted marked improvement, will f/u with PCP for repeat culture     [name] 10/04/2018, 1:00 PM

## 2018-10-04 NOTE — Progress Notes (Signed)
ED Antimicrobial Stewardship Positive Culture Follow Up   Diane Villa is an 73 y.o. female who presented to [facility] on 09/30/2018 with a chief complaint of  Chief Complaint  Patient presents with  . Abnormal Lab    Recent Results (from the past 720 hour(s))  Urine culture     Status: Abnormal   Collection Time: 10/01/18 12:45 AM  Result Value Ref Range Status   Specimen Description URINE, CLEAN CATCH  Final   Special Requests   Final    NONE Performed at [facility], [address], [place], [place] [address]    Culture >=100,000 COLONIES/mL ESCHERICHIA COLI (A)  Final   Report Status 10/03/2018 FINAL  Final   Organism ID, Bacteria ESCHERICHIA COLI (A)  Final      Susceptibility   Escherichia coli - MIC*    AMPICILLIN 8 SENSITIVE Sensitive     CEFAZOLIN <=4 SENSITIVE Sensitive     CEFTRIAXONE <=1 SENSITIVE Sensitive     CIPROFLOXACIN <=0.25 SENSITIVE Sensitive     GENTAMICIN <=1 SENSITIVE Sensitive     IMIPENEM <=0.25 SENSITIVE Sensitive     NITROFURANTOIN <=16 SENSITIVE Sensitive     TRIMETH/SULFA <=20 SENSITIVE Sensitive     AMPICILLIN/SULBACTAM 4 SENSITIVE Sensitive     PIP/TAZO <=4 SENSITIVE Sensitive     Extended ESBL NEGATIVE Sensitive     * >=100,000 COLONIES/mL ESCHERICHIA COLI    []  Treated with N/A, organism resistant to prescribed antimicrobial [x]  Patient discharged originally without antimicrobial agent and treatment is now indicated  New antibiotic prescription: If pt has UTI symptoms, start cephalexin 500mg  PO BID x 5 days  ED Provider: Burna Forts, PA   Posie Lillibridge, Drake Leach 10/04/2018, 8:07 AM Clinical Pharmacist Monday - Friday phone -  [phone] Saturday - Sunday phone - [phone]

## 2018-11-21 ENCOUNTER — Other Ambulatory Visit: Payer: Self-pay

## 2018-11-21 ENCOUNTER — Ambulatory Visit
Admission: RE | Admit: 2018-11-21 | Discharge: 2018-11-21 | Disposition: A | Discharge status: Home / Self Care | Payer: Medicare Other | Source: Ambulatory Visit | Attending: Family Medicine | Admitting: Family Medicine

## 2018-11-21 DIAGNOSIS — Z1231 Encounter for screening mammogram for malignant neoplasm of breast: Secondary | ICD-10-CM

## 2019-04-11 ENCOUNTER — Other Ambulatory Visit: Payer: Self-pay | Admitting: Dermatology

## 2019-06-28 ENCOUNTER — Other Ambulatory Visit: Payer: Self-pay

## 2019-06-28 ENCOUNTER — Ambulatory Visit: Payer: Medicare Other | Attending: Internal Medicine

## 2019-06-28 DIAGNOSIS — Z23 Encounter for immunization: Secondary | ICD-10-CM | POA: Insufficient documentation

## 2019-06-28 NOTE — Progress Notes (Signed)
   Covid-19 Vaccination Clinic  Name:  CORRIE REDER    MRN: [id number] DOB: December 30, 1945  06/28/2019  Ms. Meares was observed post Covid-19 immunization for 15 minutes without incidence. She was provided with Vaccine Information Sheet and instruction to access the V-Safe system.   Ms. Masella was instructed to call 911 with any severe reactions post vaccine: Marland Kitchen Difficulty breathing  . Swelling of your face and throat  . A fast heartbeat  . A bad rash all over your body  . Dizziness and weakness    Immunizations Administered    Name Date Dose VIS Date Route   Pfizer COVID-19 Vaccine 06/28/2019  9:02 AM 0.3 mL 05/19/2019 Intramuscular   Manufacturer: [employer], [employer]   Lot: [id number]   NDC: [address]-2   Pfizer COVID-19 Vaccine 06/28/2019  9:06 AM 0.3 mL 05/19/2019 Intramuscular   Manufacturer: [employer]   Lot: [id number]   NDC: [address]-0

## 2019-07-19 ENCOUNTER — Ambulatory Visit: Payer: Medicare Other | Attending: Internal Medicine

## 2019-07-19 DIAGNOSIS — Z23 Encounter for immunization: Secondary | ICD-10-CM | POA: Insufficient documentation

## 2019-07-19 NOTE — Progress Notes (Signed)
   Covid-19 Vaccination Clinic  Name:  Diane Villa    MRN: [id number] DOB: January 19, 1946  07/19/2019  [name] was observed post Covid-19 immunization for 15 minutes without incidence. She was provided with Vaccine Information Sheet and instruction to access the V-Safe system.   [name] was instructed to call 911 with any severe reactions post vaccine: Marland Kitchen Difficulty breathing  . Swelling of your face and throat  . A fast heartbeat  . A bad rash all over your body  . Dizziness and weakness    Immunizations Administered    Name Date Dose VIS Date Route   Pfizer COVID-19 Vaccine 07/19/2019 11:32 AM 0.3 mL 05/19/2019 Intramuscular   Manufacturer: [employer]   Lot: [id number]   NDC: [address]-2

## 2019-07-21 ENCOUNTER — Ambulatory Visit: Payer: Medicare Other

## 2019-08-30 ENCOUNTER — Ambulatory Visit: Payer: Medicare Other | Admitting: Podiatry

## 2019-08-30 ENCOUNTER — Encounter: Payer: Self-pay | Admitting: Podiatry

## 2019-08-30 ENCOUNTER — Other Ambulatory Visit: Payer: Self-pay | Admitting: Podiatry

## 2019-08-30 ENCOUNTER — Ambulatory Visit (INDEPENDENT_AMBULATORY_CARE_PROVIDER_SITE_OTHER): Payer: Medicare Other

## 2019-08-30 VITALS — Temp 97.6°F

## 2019-08-30 DIAGNOSIS — I999 Unspecified disorder of circulatory system: Secondary | ICD-10-CM

## 2019-08-30 DIAGNOSIS — M778 Other enthesopathies, not elsewhere classified: Secondary | ICD-10-CM | POA: Diagnosis not present

## 2019-08-30 DIAGNOSIS — M779 Enthesopathy, unspecified: Secondary | ICD-10-CM | POA: Diagnosis not present

## 2019-08-30 DIAGNOSIS — M79671 Pain in right foot: Secondary | ICD-10-CM

## 2019-08-30 DIAGNOSIS — M79672 Pain in left foot: Secondary | ICD-10-CM

## 2019-08-30 DIAGNOSIS — L600 Ingrowing nail: Secondary | ICD-10-CM | POA: Diagnosis not present

## 2019-08-30 NOTE — Patient Instructions (Signed)

## 2019-08-30 NOTE — Progress Notes (Signed)
Subjective:   Patient ID: Diane Villa, female   DOB: 74 y.o.   MRN: [id number]   HPI Patient presents stating that she has developed a lot of pain on the outside of her left foot she is got pain in the right big toe joint which is been present for a long time and states that she is developing increased calf pain and hip pain on the right side and also is noted to have an incurvated left hallux nail lateral border that is painful   ROS      Objective:  Physical Exam  Neuro vascular status found to be diminished on the right side over the left side with no palpable PT or DP pulses with patient found to have inflammation around the first MPJ right limited range of motion and normal left is found to have inflammation around the lateral peroneal complex and also the lateral border of the left hallux.  New     Assessment:  Multiple problems with 1 being hallux limitus right with possibility for vascular disease or clotting right along with ingrown toenail deformity left and peroneal tertius deformity left     Plan:  H&P reviewed all conditions and I am sending him for vascular studies for the right.  I went ahead today and I did inject around the first MPJ right 3 mg Dexasone Kenalog 5 mg Xylocaine and the left peroneal tertius tendon complex 3 mg Kenalog 5 mg Xylocaine trimmed out the lateral border left hallux discussed possible permanent correction in future and we will get the results of vascular studies and decide what else may be appropriate and there is a possibility that we will find a problem  X-rays indicate significant spurring of the first MPJ right over left with no other indications of pathology

## 2019-09-01 ENCOUNTER — Telehealth: Payer: Self-pay | Admitting: *Deleted

## 2019-09-01 DIAGNOSIS — I999 Unspecified disorder of circulatory system: Secondary | ICD-10-CM

## 2019-09-01 NOTE — Telephone Encounter (Signed)
Pt states Dr.Regal had wanted her to have vascular studies. I reviewed Dr. Beverlee Nims 08/30/2019 clinicals and he had wanted vascular studies. Faxed orders to [facility].

## 2019-09-01 NOTE — Telephone Encounter (Signed)
I informed pt the orders had been sent to [facility] [phone] and she could call later today if she did not want to wait for their call.

## 2019-09-05 ENCOUNTER — Ambulatory Visit (HOSPITAL_COMMUNITY)
Admission: RE | Admit: 2019-09-05 | Discharge: 2019-09-05 | Disposition: A | Discharge status: Home / Self Care | Payer: Medicare Other | Source: Ambulatory Visit | Attending: Cardiovascular Disease | Admitting: Cardiovascular Disease

## 2019-09-05 ENCOUNTER — Other Ambulatory Visit: Payer: Self-pay

## 2019-09-05 DIAGNOSIS — I999 Unspecified disorder of circulatory system: Secondary | ICD-10-CM | POA: Insufficient documentation

## 2019-09-12 ENCOUNTER — Ambulatory Visit: Payer: Medicare Other | Admitting: Cardiovascular Disease

## 2019-09-12 ENCOUNTER — Encounter: Payer: Self-pay | Admitting: Cardiovascular Disease

## 2019-09-12 ENCOUNTER — Other Ambulatory Visit: Payer: Self-pay

## 2019-09-12 VITALS — BP 140/80 | HR 71 | Ht 61.5 in | Wt 128.0 lb

## 2019-09-12 DIAGNOSIS — I10 Essential (primary) hypertension: Secondary | ICD-10-CM | POA: Diagnosis not present

## 2019-09-12 DIAGNOSIS — Z01812 Encounter for preprocedural laboratory examination: Secondary | ICD-10-CM | POA: Diagnosis not present

## 2019-09-12 DIAGNOSIS — E782 Mixed hyperlipidemia: Secondary | ICD-10-CM

## 2019-09-12 DIAGNOSIS — E785 Hyperlipidemia, unspecified: Secondary | ICD-10-CM | POA: Insufficient documentation

## 2019-09-12 DIAGNOSIS — I739 Peripheral vascular disease, unspecified: Secondary | ICD-10-CM | POA: Diagnosis not present

## 2019-09-12 NOTE — Patient Instructions (Signed)
Your physician recommends that you continue on your current medications as directed. Please refer to the Current Medication list given to you today.  Your physician recommends that you return for lab work in: TODAY BMET CBC AND INR               [facility] CARDIOVASCULAR DIVISION [facility] [address] [place] [address] [place] [address] Dept: [phone] Loc: [phone]  Diane Villa  09/12/2019  You are scheduled for a 09/25/19 on Monday, April 19 with Dr. Nanetta Batty.  1. Please arrive at the [facility] (Main Entrance A) at [facility]: [address] [place], [place] [address] at 5:30 AM (This time is two hours before your procedure to ensure your preparation). Free valet parking service is available.   Special note: Every effort is made to have your procedure done on time. Please understand that emergencies sometimes delay scheduled procedures.  2. Diet: Do not eat solid foods after midnight.  The patient may have clear liquids until 5am upon the day of the procedure.  3. Labs: TODAY. Medication instructions in preparation for your procedure:   Contrast Allergy: No    Current Outpatient Medications (Cardiovascular):  .  amLODipine (NORVASC) 2.5 MG tablet, Take 2.5 mg by mouth daily. Marland Kitchen  atenolol (TENORMIN) 100 MG tablet, Take 100 mg by mouth daily. Marland Kitchen  losartan (COZAAR) 100 MG tablet, Take 100 mg by mouth daily.     Current Outpatient Medications (Other):  Marland Kitchen  ALPRAZolam (XANAX) 0.5 MG tablet, TK 1/2 T PO TID PRA .  cholecalciferol (VITAMIN D) 1000 UNITS tablet, Take 1,000 Units by mouth daily. *For reference purposes while preparing patient instructions.   Delete this med list prior to printing instructions for patient.*   On the morning of your procedure, take your Aspirin and any morning medicines NOT listed above.  You may use sips of water.  5. Plan for one night stay--bring personal belongings. 6.  Bring a current list of your medications and current insurance cards. 7. You MUST have a responsible person to drive you home. 8. Someone MUST be with you the first 24 hours after you arrive home or your discharge will be delayed. 9. Please wear clothes that are easy to get on and off and wear slip-on shoes.  Thank you for allowing [place] to care for you!   -- [facility]

## 2019-09-12 NOTE — Assessment & Plan Note (Signed)
Diane Villa was referred to me by Dr. Charlsie Merles for symptomatic lifestyle limiting PAD.  She does have a history of treated hypertension and untreated hyperlipidemia.  She is very active and exercises on a daily basis including playing tennis.  She has had right calf claudication for approximately 2 months with recent Doppler studies performed in our office 09/05/2019 revealing a right ABI of 0.55 and a left of 1.03.  She appears to have an occluded right SFA and requires angiography and endovascular therapy.

## 2019-09-12 NOTE — Assessment & Plan Note (Signed)
History of essential hypertension blood pressure measured today 140/80.  She is on atenolol and losartan as well as amlodipine.

## 2019-09-12 NOTE — Assessment & Plan Note (Signed)
History of hyperlipidemia with lipid profile performed 02/15/2019 revealing total cholesterol 197, LDL 120 and HDL of 51 currently not on statin therapy.

## 2019-09-12 NOTE — H&P (View-Only) (Signed)
   09/12/2019 Diane Villa   03/18/1946  [id number]  Primary Physician White, Cynthia, MD Primary Cardiologist: Dana Debo J Tkai Large MD FACP, FACC, FAHA, FSCAI  HPI:  Diane Villa is a 74 y.o. thin and fit appearing widowed Caucasian female mother of 2 children, grandmother of 1 grandchild who was referred to me by Dr. Regal, her podiatrist for right calf lifestyle limiting claudication.  She is a retired accountant and tax prepare.  Her only cardiovascular risk factors are treated hypertension and untreated hyperlipidemia.  There is no family history.  She is never had a heart attack or stroke.  She denies chest pain or shortness of breath.  She exercises on a daily basis 70 to 100 minutes a day including playing tennis.  She is noted right calf claudication over the last several months which is lifestyle limiting.  Dopplers performed 09/05/2019 revealed a right ankle-brachial index of 0.55 and a left of 1.03 with an occluded right SFA.  The patient desires to proceed with angiography and endovascular therapy.   Current Meds  Medication Sig  . ALPRAZolam (XANAX) 0.5 MG tablet TK 1/2 T PO TID PRA  . amLODipine (NORVASC) 2.5 MG tablet Take 2.5 mg by mouth daily.  . atenolol (TENORMIN) 100 MG tablet Take 100 mg by mouth daily.  . cholecalciferol (VITAMIN D) 1000 UNITS tablet Take 1,000 Units by mouth daily.  . losartan (COZAAR) 100 MG tablet Take 100 mg by mouth daily.     Allergies  Allergen Reactions  . Ambien [Zolpidem Tartrate]   . Sulfur     RASH ON ABDOMEN    Social History   Socioeconomic History  . Marital status: Widowed    Spouse name: Not on file  . Number of children: 2  . Years of education: Not on file  . Highest education level: Not on file  Occupational History  . Occupation: retired    Comment: accounts payable  Tobacco Use  . Smoking status: Never Smoker  . Smokeless tobacco: Never Used  Substance and Sexual Activity  . Alcohol use: No  . Drug use: No  .  Sexual activity: Not on file  Other Topics Concern  . Not on file  Social History Narrative  . Not on file   Social Determinants of Health   Financial Resource Strain:   . Difficulty of Paying Living Expenses:   Food Insecurity:   . Worried About Running Out of Food in the Last Year:   . Ran Out of Food in the Last Year:   Transportation Needs:   . Lack of Transportation (Medical):   . Lack of Transportation (Non-Medical):   Physical Activity:   . Days of Exercise per Week:   . Minutes of Exercise per Session:   Stress:   . Feeling of Stress :   Social Connections:   . Frequency of Communication with Friends and Family:   . Frequency of Social Gatherings with Friends and Family:   . Attends Religious Services:   . Active Member of Clubs or Organizations:   . Attends Club or Organization Meetings:   . Marital Status:   Intimate Partner Violence:   . Fear of Current or Ex-Partner:   . Emotionally Abused:   . Physically Abused:   . Sexually Abused:      Review of Systems: General: negative for chills, fever, night sweats or weight changes.  Cardiovascular: negative for chest pain, dyspnea on exertion, edema, orthopnea, palpitations, paroxysmal nocturnal dyspnea or shortness   of breath Dermatological: negative for rash Respiratory: negative for cough or wheezing Urologic: negative for hematuria Abdominal: negative for nausea, vomiting, diarrhea, bright red blood per rectum, melena, or hematemesis Neurologic: negative for visual changes, syncope, or dizziness All other systems reviewed and are otherwise negative except as noted above.    Blood pressure 140/80, pulse 71, height 5' 1.5" (1.562 m), weight 128 lb (58.1 kg).  General appearance: alert and no distress Neck: no adenopathy, no carotid bruit, no JVD, supple, symmetrical, trachea midline and thyroid not enlarged, symmetric, no tenderness/mass/nodules Lungs: clear to auscultation bilaterally Heart: regular rate and  rhythm, S1, S2 normal, no murmur, click, rub or gallop Extremities: extremities normal, atraumatic, no cyanosis or edema Pulses: 2+ and symmetric Skin: Skin color, texture, turgor normal. No rashes or lesions Neurologic: Alert and oriented X 3, normal strength and tone. Normal symmetric reflexes. Normal coordination and gait  EKG sinus rhythm 71 with nonspecific ST and T wave changes.  Personally reviewed this EKG.  ASSESSMENT AND PLAN:   Peripheral arterial disease (HCC) Diane Villa was referred to me by Dr. Charlsie Merles for symptomatic lifestyle limiting PAD.  She does have a history of treated hypertension and untreated hyperlipidemia.  She is very active and exercises on a daily basis including playing tennis.  She has had right calf claudication for approximately 2 months with recent Doppler studies performed in our office 09/05/2019 revealing a right ABI of 0.55 and a left of 1.03.  She appears to have an occluded right SFA and requires angiography and endovascular therapy.  Essential hypertension History of essential hypertension blood pressure measured today 140/80.  She is on atenolol and losartan as well as amlodipine.  Hyperlipidemia History of hyperlipidemia with lipid profile performed 02/15/2019 revealing total cholesterol 197, LDL 120 and HDL of 51 currently not on statin therapy.      Runell Gess MD FACP,FACC,FAHA, [facility], [place] 09/12/2019 1:59 PM

## 2019-09-12 NOTE — Progress Notes (Signed)
09/12/2019 Diane Villa   Jun 22, 1945  [id number]  Primary Physician Diane Stains, MD Primary Cardiologist: Diane Harp MD Diane Villa, [place]  HPI:  Diane Villa is a 74 y.o. thin and fit appearing widowed Caucasian female mother of 2 children, grandmother of 1 grandchild who was referred to me by Dr. Paulla Villa, her podiatrist for right calf lifestyle limiting claudication.  She is a retired Optometrist.  Her only cardiovascular risk factors are treated hypertension and untreated hyperlipidemia.  There is no family history.  She is never had a heart attack or stroke.  She denies chest pain or shortness of breath.  She exercises on a daily basis 70 to 100 minutes a day including playing tennis.  She is noted right calf claudication over the last several months which is lifestyle limiting.  Dopplers performed 09/05/2019 revealed a right ankle-brachial index of 0.55 and a left of 1.03 with an occluded right SFA.  The patient desires to proceed with angiography and endovascular therapy.   Current Meds  Medication Sig  . ALPRAZolam (XANAX) 0.5 MG tablet TK 1/2 T PO TID PRA  . amLODipine (NORVASC) 2.5 MG tablet Take 2.5 mg by mouth daily.  Marland Kitchen atenolol (TENORMIN) 100 MG tablet Take 100 mg by mouth daily.  . cholecalciferol (VITAMIN D) 1000 UNITS tablet Take 1,000 Units by mouth daily.  Marland Kitchen losartan (COZAAR) 100 MG tablet Take 100 mg by mouth daily.     Allergies  Allergen Reactions  . Ambien [Zolpidem Tartrate]   . Sulfur     RASH ON ABDOMEN    Social History   Socioeconomic History  . Marital status: Widowed    Spouse name: Not on file  . Number of children: 2  . Years of education: Not on file  . Highest education level: Not on file  Occupational History  . Occupation: retired    Comment: accounts payable  Tobacco Use  . Smoking status: Never Smoker  . Smokeless tobacco: Never Used  Substance and Sexual Activity  . Alcohol use: No  . Drug use: No  .  Sexual activity: Not on file  Other Topics Concern  . Not on file  Social History Narrative  . Not on file   Social Determinants of Health   Financial Resource Strain:   . Difficulty of Paying Living Expenses:   Food Insecurity:   . Worried About Charity fundraiser in the Last Year:   . Arboriculturist in the Last Year:   Transportation Needs:   . Film/video editor (Medical):   Marland Kitchen Lack of Transportation (Non-Medical):   Physical Activity:   . Days of Exercise per Week:   . Minutes of Exercise per Session:   Stress:   . Feeling of Stress :   Social Connections:   . Frequency of Communication with Friends and Family:   . Frequency of Social Gatherings with Friends and Family:   . Attends Religious Services:   . Active Member of Clubs or Organizations:   . Attends Archivist Meetings:   Marland Kitchen Marital Status:   Intimate Partner Violence:   . Fear of Current or Ex-Partner:   . Emotionally Abused:   Marland Kitchen Physically Abused:   . Sexually Abused:      Review of Systems: General: negative for chills, fever, night sweats or weight changes.  Cardiovascular: negative for chest pain, dyspnea on exertion, edema, orthopnea, palpitations, paroxysmal nocturnal dyspnea or shortness  of breath Dermatological: negative for rash Respiratory: negative for cough or wheezing Urologic: negative for hematuria Abdominal: negative for nausea, vomiting, diarrhea, bright red blood per rectum, melena, or hematemesis Neurologic: negative for visual changes, syncope, or dizziness All other systems reviewed and are otherwise negative except as noted above.    Blood pressure 140/80, pulse 71, height 5' 1.5" (1.562 m), weight 128 lb (58.1 kg).  General appearance: alert and no distress Neck: no adenopathy, no carotid bruit, no JVD, supple, symmetrical, trachea midline and thyroid not enlarged, symmetric, no tenderness/mass/nodules Lungs: clear to auscultation bilaterally Heart: regular rate and  rhythm, S1, S2 normal, no murmur, click, rub or gallop Extremities: extremities normal, atraumatic, no cyanosis or edema Pulses: 2+ and symmetric Skin: Skin color, texture, turgor normal. No rashes or lesions Neurologic: Alert and oriented X 3, normal strength and tone. Normal symmetric reflexes. Normal coordination and gait  EKG sinus rhythm 71 with nonspecific ST and T wave changes.  Personally reviewed this EKG.  ASSESSMENT AND PLAN:   Peripheral arterial disease (HCC) Diane Villa was referred to me by Dr. Charlsie Villa for symptomatic lifestyle limiting PAD.  She does have a history of treated hypertension and untreated hyperlipidemia.  She is very active and exercises on a daily basis including playing tennis.  She has had right calf claudication for approximately 2 months with recent Doppler studies performed in our office 09/05/2019 revealing a right ABI of 0.55 and a left of 1.03.  She appears to have an occluded right SFA and requires angiography and endovascular therapy.  Essential hypertension History of essential hypertension blood pressure measured today 140/80.  She is on atenolol and losartan as well as amlodipine.  Hyperlipidemia History of hyperlipidemia with lipid profile performed 02/15/2019 revealing total cholesterol 197, LDL 120 and HDL of 51 currently not on statin therapy.      Diane Gess MD FACP,FACC,FAHA, [facility], [place] 09/12/2019 1:59 PM

## 2019-09-13 LAB — CBC
Hematocrit: 50.8 % — ABNORMAL HIGH (ref 34.0–46.6)
Hemoglobin: 16.4 g/dL — ABNORMAL HIGH (ref 11.1–15.9)
MCH: 29.1 pg (ref 26.6–33.0)
MCHC: 32.3 g/dL (ref 31.5–35.7)
MCV: 90 fL (ref 79–97)
Platelets: 203 10*3/uL (ref 150–450)
RBC: 5.63 x10E6/uL — ABNORMAL HIGH (ref 3.77–5.28)
RDW: 12.3 % (ref 11.7–15.4)
WBC: 6.2 10*3/uL (ref 3.4–10.8)

## 2019-09-13 LAB — BASIC METABOLIC PANEL
BUN/Creatinine Ratio: 27 (ref 12–28)
BUN: 24 mg/dL (ref 8–27)
CO2: 25 mmol/L (ref 20–29)
Calcium: 9.9 mg/dL (ref 8.7–10.3)
Chloride: 104 mmol/L (ref 96–106)
Creatinine, Ser: 0.89 mg/dL (ref 0.57–1.00)
GFR calc Af Amer: 75 mL/min/{1.73_m2} (ref >59)
GFR calc non Af Amer: 65 mL/min/{1.73_m2} (ref >59)
Glucose: 162 mg/dL — ABNORMAL HIGH (ref 65–99)
Potassium: 4.7 mmol/L (ref 3.5–5.2)
Sodium: 145 mmol/L — ABNORMAL HIGH (ref 134–144)

## 2019-09-13 LAB — PROTIME-INR
INR: 0.9 (ref 0.9–1.2)
Prothrombin Time: 10.2 s (ref 9.1–12.0)

## 2019-09-19 ENCOUNTER — Other Ambulatory Visit: Payer: Self-pay | Admitting: *Deleted

## 2019-09-19 DIAGNOSIS — I739 Peripheral vascular disease, unspecified: Secondary | ICD-10-CM

## 2019-09-19 MED ORDER — SODIUM CHLORIDE 0.9% FLUSH: 3.0000 mL | Freq: Two times a day (BID) | INTRAVENOUS | Status: DC

## 2019-09-21 ENCOUNTER — Telehealth: Payer: Self-pay | Admitting: *Deleted

## 2019-09-21 NOTE — Telephone Encounter (Addendum)
Pt contacted pre-abdominal aortogram  scheduled at [facility] for: Monday September 25, 2019 7:30 AM Verified arrival time and place: [facility] Main Entrance A [facility] [place] [facility]) at: 5:30 AM   No solid food after midnight prior to cath, clear liquids until 5 AM day of procedure.   AM meds can be  taken pre-cath with sip of water including: ASA 81 mg   Confirmed patient has responsible adult to drive home post procedure and observe 24 hours after arriving home: yes  Currently, due to Covid-19 pandemic, only one person will be allowed with patient. Must be the same person for patient's entire stay and will be required to wear a mask. They will be asked to wait in the waiting room for the duration of the patient's stay.  Patients are required to wear a mask when they enter the hospital.      COVID-19 Pre-Screening Questions:  . In the past 7 to 10 days have you had a cough,  shortness of breath, headache, congestion, fever (100 or greater) body aches, chills, sore throat, or sudden loss of taste or sense of smell? no . Have you been around anyone with known Covid 19 in the past 7 to 10 days? no . Have you been around anyone who is awaiting Covid 19 test results in the past 7 to 10 days? no . Have you been around anyone who has been exposed to Covid 19, or has mentioned symptoms of Covid 19 within the past 7 to 10 days? No  I reviewed procedure/mask/visitor, COVID-19 screening questions with patient.

## 2019-09-22 ENCOUNTER — Other Ambulatory Visit (HOSPITAL_COMMUNITY)
Admission: RE | Admit: 2019-09-22 | Discharge: 2019-09-22 | Disposition: A | Discharge status: Home / Self Care | Payer: Medicare Other | Source: Ambulatory Visit | Attending: Cardiovascular Disease | Admitting: Cardiovascular Disease

## 2019-09-22 DIAGNOSIS — Z01812 Encounter for preprocedural laboratory examination: Secondary | ICD-10-CM | POA: Diagnosis present

## 2019-09-22 DIAGNOSIS — Z20822 Contact with and (suspected) exposure to covid-19: Secondary | ICD-10-CM | POA: Insufficient documentation

## 2019-09-22 LAB — SARS CORONAVIRUS 2 (TAT 6-24 HRS): SARS Coronavirus 2: NEGATIVE

## 2019-09-25 ENCOUNTER — Ambulatory Visit (HOSPITAL_COMMUNITY)
Admission: RE | Admit: 2019-09-25 | Discharge: 2019-09-26 | Disposition: A | Discharge status: Home / Self Care | Payer: Medicare Other | Attending: Cardiovascular Disease | Admitting: Cardiovascular Disease

## 2019-09-25 ENCOUNTER — Encounter (HOSPITAL_COMMUNITY)
Admission: RE | Disposition: A | Discharge status: Home / Self Care | Payer: Self-pay | Source: Home / Self Care | Attending: Cardiovascular Disease

## 2019-09-25 ENCOUNTER — Other Ambulatory Visit: Payer: Self-pay

## 2019-09-25 DIAGNOSIS — Z79899 Other long term (current) drug therapy: Secondary | ICD-10-CM | POA: Insufficient documentation

## 2019-09-25 DIAGNOSIS — E785 Hyperlipidemia, unspecified: Secondary | ICD-10-CM | POA: Insufficient documentation

## 2019-09-25 DIAGNOSIS — I739 Peripheral vascular disease, unspecified: Secondary | ICD-10-CM

## 2019-09-25 DIAGNOSIS — I70211 Atherosclerosis of native arteries of extremities with intermittent claudication, right leg: Secondary | ICD-10-CM | POA: Insufficient documentation

## 2019-09-25 DIAGNOSIS — Z888 Allergy status to other drugs, medicaments and biological substances status: Secondary | ICD-10-CM | POA: Diagnosis not present

## 2019-09-25 DIAGNOSIS — I10 Essential (primary) hypertension: Secondary | ICD-10-CM | POA: Diagnosis not present

## 2019-09-25 HISTORY — PX: ABDOMINAL AORTOGRAM W/LOWER EXTREMITY: CATH118223

## 2019-09-25 HISTORY — PX: PERIPHERAL VASCULAR ATHERECTOMY: CATH118256

## 2019-09-25 LAB — POCT ACTIVATED CLOTTING TIME
Activated Clotting Time: 169 seconds
Activated Clotting Time: 246 seconds
Activated Clotting Time: 313 seconds

## 2019-09-25 SURGERY — ABDOMINAL AORTOGRAM W/LOWER EXTREMITY
Anesthesia: LOCAL | Laterality: Right

## 2019-09-25 MED ORDER — ACETAMINOPHEN 325 MG PO TABS: 650.0000 mg | ORAL_TABLET | ORAL | Status: DC | PRN

## 2019-09-25 MED ORDER — HYDRALAZINE HCL 20 MG/ML IJ SOLN
INTRAMUSCULAR | Status: DC | PRN
  Administered 2019-09-25: 10 mg via INTRAVENOUS

## 2019-09-25 MED ORDER — ATROPINE SULFATE 1 MG/10ML IJ SOSY
1.0000 mg | PREFILLED_SYRINGE | INTRAMUSCULAR | Status: AC
  Administered 2019-09-25: 12:00:00 0.5 mg via INTRAVENOUS

## 2019-09-25 MED ORDER — SODIUM CHLORIDE 0.9 % IV SOLN: 250.0000 mL | INTRAVENOUS | Status: DC | PRN

## 2019-09-25 MED ORDER — SODIUM CHLORIDE 0.9 % WEIGHT BASED INFUSION
3.0000 mL/kg/h | INTRAVENOUS | Status: DC
  Administered 2019-09-25: 3 mL/kg/h via INTRAVENOUS

## 2019-09-25 MED ORDER — CLOPIDOGREL BISULFATE 75 MG PO TABS
75.0000 mg | ORAL_TABLET | Freq: Every day | ORAL | Status: DC
  Administered 2019-09-26: 75 mg via ORAL
  Filled 2019-09-25: qty 1

## 2019-09-25 MED ORDER — SODIUM CHLORIDE 0.9 % WEIGHT BASED INFUSION: 1.0000 mL/kg/h | INTRAVENOUS | Status: DC

## 2019-09-25 MED ORDER — MIDAZOLAM HCL 2 MG/2ML IJ SOLN
INTRAMUSCULAR | Status: AC
  Filled 2019-09-25: qty 2

## 2019-09-25 MED ORDER — AMLODIPINE BESYLATE 2.5 MG PO TABS
2.5000 mg | ORAL_TABLET | Freq: Every day | ORAL | Status: DC
  Administered 2019-09-25: 22:00:00 2.5 mg via ORAL
  Filled 2019-09-25: qty 1

## 2019-09-25 MED ORDER — FENTANYL CITRATE (PF) 100 MCG/2ML IJ SOLN
INTRAMUSCULAR | Status: AC
  Filled 2019-09-25: qty 2

## 2019-09-25 MED ORDER — IODIXANOL 320 MG/ML IV SOLN
INTRAVENOUS | Status: DC | PRN
  Administered 2019-09-25: 09:00:00 200 mL

## 2019-09-25 MED ORDER — SODIUM CHLORIDE 0.9% FLUSH: 3.0000 mL | INTRAVENOUS | Status: DC | PRN

## 2019-09-25 MED ORDER — MIDAZOLAM HCL 2 MG/2ML IJ SOLN
INTRAMUSCULAR | Status: DC | PRN
  Administered 2019-09-25: 1 mg via INTRAVENOUS

## 2019-09-25 MED ORDER — ATROPINE SULFATE 1 MG/10ML IJ SOSY
PREFILLED_SYRINGE | INTRAMUSCULAR | Status: AC
  Filled 2019-09-25: qty 10

## 2019-09-25 MED ORDER — LIDOCAINE HCL (PF) 1 % IJ SOLN
INTRAMUSCULAR | Status: AC
  Filled 2019-09-25: qty 30

## 2019-09-25 MED ORDER — ASPIRIN 81 MG PO CHEW: 81.0000 mg | CHEWABLE_TABLET | ORAL | Status: DC

## 2019-09-25 MED ORDER — ATORVASTATIN CALCIUM 80 MG PO TABS
80.0000 mg | ORAL_TABLET | Freq: Every day | ORAL | Status: DC
  Administered 2019-09-25: 18:00:00 80 mg via ORAL
  Filled 2019-09-25: qty 1

## 2019-09-25 MED ORDER — HYDRALAZINE HCL 20 MG/ML IJ SOLN: 5.0000 mg | INTRAMUSCULAR | Status: DC | PRN

## 2019-09-25 MED ORDER — FENTANYL CITRATE (PF) 100 MCG/2ML IJ SOLN
INTRAMUSCULAR | Status: DC | PRN
  Administered 2019-09-25 (×2): 25 ug via INTRAVENOUS

## 2019-09-25 MED ORDER — MORPHINE SULFATE (PF) 2 MG/ML IV SOLN: 2.0000 mg | INTRAVENOUS | Status: DC | PRN

## 2019-09-25 MED ORDER — ONDANSETRON HCL 4 MG/2ML IJ SOLN: 4.0000 mg | Freq: Four times a day (QID) | INTRAMUSCULAR | Status: DC | PRN

## 2019-09-25 MED ORDER — HEPARIN SODIUM (PORCINE) 1000 UNIT/ML IJ SOLN
INTRAMUSCULAR | Status: AC
  Filled 2019-09-25: qty 1

## 2019-09-25 MED ORDER — CLOPIDOGREL BISULFATE 300 MG PO TABS
ORAL_TABLET | ORAL | Status: DC | PRN
  Administered 2019-09-25: 300 mg via ORAL

## 2019-09-25 MED ORDER — ATENOLOL 25 MG PO TABS
100.0000 mg | ORAL_TABLET | Freq: Every day | ORAL | Status: DC
  Administered 2019-09-25: 100 mg via ORAL
  Filled 2019-09-25: qty 4

## 2019-09-25 MED ORDER — SODIUM CHLORIDE 0.9% FLUSH: 3.0000 mL | Freq: Two times a day (BID) | INTRAVENOUS | Status: DC

## 2019-09-25 MED ORDER — LOSARTAN POTASSIUM 50 MG PO TABS
100.0000 mg | ORAL_TABLET | Freq: Every day | ORAL | Status: DC
  Administered 2019-09-25: 22:00:00 100 mg via ORAL
  Filled 2019-09-25: qty 2

## 2019-09-25 MED ORDER — HEPARIN (PORCINE) IN NACL 1000-0.9 UT/500ML-% IV SOLN
INTRAVENOUS | Status: DC | PRN
  Administered 2019-09-25 (×2): 500 mL

## 2019-09-25 MED ORDER — HYDRALAZINE HCL 20 MG/ML IJ SOLN
INTRAMUSCULAR | Status: AC
  Filled 2019-09-25: qty 1

## 2019-09-25 MED ORDER — HEPARIN (PORCINE) IN NACL 1000-0.9 UT/500ML-% IV SOLN
INTRAVENOUS | Status: AC
  Filled 2019-09-25: qty 1000

## 2019-09-25 MED ORDER — ASPIRIN EC 81 MG PO TBEC
81.0000 mg | DELAYED_RELEASE_TABLET | Freq: Every day | ORAL | Status: DC
  Administered 2019-09-26: 08:00:00 81 mg via ORAL
  Filled 2019-09-25: qty 1

## 2019-09-25 MED ORDER — LIDOCAINE HCL (PF) 1 % IJ SOLN
INTRAMUSCULAR | Status: DC | PRN
  Administered 2019-09-25: 20 mL

## 2019-09-25 MED ORDER — SODIUM CHLORIDE 0.9 % IV SOLN
INTRAVENOUS | Status: AC
  Administered 2019-09-25: 500 mL via INTRAVENOUS

## 2019-09-25 MED ORDER — ALPRAZOLAM 0.25 MG PO TABS: 0.2500 mg | ORAL_TABLET | Freq: Three times a day (TID) | ORAL | Status: DC | PRN

## 2019-09-25 MED ORDER — LABETALOL HCL 5 MG/ML IV SOLN: 10.0000 mg | INTRAVENOUS | Status: DC | PRN

## 2019-09-25 MED ORDER — HEPARIN SODIUM (PORCINE) 1000 UNIT/ML IJ SOLN
INTRAMUSCULAR | Status: DC | PRN
  Administered 2019-09-25: 7000 [IU] via INTRAVENOUS
  Administered 2019-09-25: 3000 [IU] via INTRAVENOUS

## 2019-09-25 SURGICAL SUPPLY — 23 items
BAG SNAP BAND KOVER 36X36 (MISCELLANEOUS) ×3 IMPLANT
BALLN COYOTE OTW 2.5X60X150 (BALLOONS) ×3
BALLN IN.PACT DCB 5X80 (BALLOONS) ×3
BALLOON COYOTE OTW 2.5X60X150 (BALLOONS) ×2 IMPLANT
CATH ANGIO 5F PIGTAIL 65CM (CATHETERS) ×3 IMPLANT
CATH CROSS OVER TEMPO 5F (CATHETERS) ×3 IMPLANT
CATH HAWKONE LX EXTENDED TIP (CATHETERS) ×3 IMPLANT
CATH VIANCE CROSS STAND 150CM (MICROCATHETER) ×3
CATH VIANCE CROSS STD 150CM (MICROCATHETER) ×2 IMPLANT
CLOSURE MYNX CONTROL 6F/7F (Vascular Products) ×3 IMPLANT
DCB IN.PACT 5X80 (BALLOONS) ×2 IMPLANT
DEVICE SPIDERFX EMB PROT 6MM (WIRE) ×3 IMPLANT
GUIDEWIRE ZILIENT 6G 014 (WIRE) ×3 IMPLANT
KIT ENCORE 26 ADVANTAGE (KITS) ×3 IMPLANT
KIT PV (KITS) ×3 IMPLANT
SHEATH HIGHFLEX ANSEL 7FR 55CM (SHEATH) ×3 IMPLANT
SHEATH PINNACLE 5F 10CM (SHEATH) ×3 IMPLANT
SHEATH PINNACLE 7F 10CM (SHEATH) ×3 IMPLANT
SHEATH PROBE COVER 6X72 (BAG) ×3 IMPLANT
SYR MEDRAD MARK 7 150ML (SYRINGE) ×3 IMPLANT
TRANSDUCER W/STOPCOCK (MISCELLANEOUS) ×3 IMPLANT
TRAY PV CATH (CUSTOM PROCEDURE TRAY) ×3 IMPLANT
WIRE HITORQ VERSACORE ST 145CM (WIRE) ×3 IMPLANT

## 2019-09-25 NOTE — Interval H&P Note (Signed)
History and Physical Interval Note:  09/25/2019 7:32 AM  Diane Villa  has presented today for surgery, with the diagnosis of peripheral vascular disease.  The various methods of treatment have been discussed with the patient and family. After consideration of risks, benefits and other options for treatment, the patient has consented to  Procedure(s): ABDOMINAL AORTOGRAM W/LOWER EXTREMITY (N/A) as a surgical intervention.  The patient's history has been reviewed, patient examined, no change in status, stable for surgery.  I have reviewed the patient's chart and labs.  Questions were answered to the patient's satisfaction.     Nanetta Batty

## 2019-09-25 NOTE — Progress Notes (Signed)
I was checking on Diane Villa when she started feeling light headed and dizzy. Hr was 39bpm. I put her stretcher in reverse trendelenburg cycled a Bp.Bp 84/43. Atropine .5mg  was given and fluid bolus of 500 ml.. Dr notified in [facility] lab. Pt did no vomit,and was alert during this episode. Pt feeling better now and blood pressure is 101/42, 97/42 still soft, but heart rate is back at 68 sinus.

## 2019-09-26 ENCOUNTER — Other Ambulatory Visit: Payer: Self-pay | Admitting: Cardiology

## 2019-09-26 DIAGNOSIS — I70211 Atherosclerosis of native arteries of extremities with intermittent claudication, right leg: Secondary | ICD-10-CM | POA: Diagnosis not present

## 2019-09-26 DIAGNOSIS — I739 Peripheral vascular disease, unspecified: Secondary | ICD-10-CM

## 2019-09-26 LAB — BASIC METABOLIC PANEL WITH GFR
Anion gap: 7 (ref 5–15)
BUN: 12 mg/dL (ref 8–23)
CO2: 24 mmol/L (ref 22–32)
Calcium: 8.5 mg/dL — ABNORMAL LOW (ref 8.9–10.3)
Chloride: 112 mmol/L — ABNORMAL HIGH (ref 98–111)
Creatinine, Ser: 0.93 mg/dL (ref 0.44–1.00)
GFR calc Af Amer: >60 mL/min
GFR calc non Af Amer: >60 mL/min
Glucose, Bld: 101 mg/dL — ABNORMAL HIGH (ref 70–99)
Potassium: 3.8 mmol/L (ref 3.5–5.1)
Sodium: 143 mmol/L (ref 135–145)

## 2019-09-26 LAB — CBC
HCT: 38.8 % (ref 36.0–46.0)
Hemoglobin: 12.6 g/dL (ref 12.0–15.0)
MCH: 29.9 pg (ref 26.0–34.0)
MCHC: 32.5 g/dL (ref 30.0–36.0)
MCV: 92.2 fL (ref 80.0–100.0)
Platelets: 147 K/uL — ABNORMAL LOW (ref 150–400)
RBC: 4.21 MIL/uL (ref 3.87–5.11)
RDW: 13.1 % (ref 11.5–15.5)
WBC: 6.2 K/uL (ref 4.0–10.5)
nRBC: 0 % (ref 0.0–0.2)

## 2019-09-26 MED ORDER — ASPIRIN 81 MG PO TBEC: 81.0000 mg | DELAYED_RELEASE_TABLET | Freq: Every day | ORAL | 3 refills | Status: DC

## 2019-09-26 MED ORDER — ATORVASTATIN CALCIUM 80 MG PO TABS: 80.0000 mg | ORAL_TABLET | Freq: Every day | ORAL | 11 refills | Status: DC

## 2019-09-26 MED ORDER — CLOPIDOGREL BISULFATE 75 MG PO TABS: 75.0000 mg | ORAL_TABLET | Freq: Every day | ORAL | 11 refills | Status: DC

## 2019-09-26 MED FILL — ASPIRIN LOW DOSE 81 MG TBEC: 81 | 90 days supply | Qty: 90 | Fill #0

## 2019-09-26 MED FILL — CLOPIDOGREL 75 MG TABLET: 75 | 30 days supply | Qty: 30 | Fill #0

## 2019-09-26 MED FILL — ATORVASTATIN CALCIUM 80 MG: 80 | 30 days supply | Qty: 30 | Fill #0

## 2019-09-26 NOTE — Discharge Summary (Addendum)
The patient has been seen in conjunction with Reino Bellis, NP. All aspects of care have been considered and discussed. The patient has been personally interviewed, examined, and all clinical data has been reviewed.   Has ambulated.  Doing well.  Has large ecchymosis left medial thigh.  Access site is soft.  No bruit.  Ready for discharge with follow-up as outlined below.   Discharge Summary    Patient ID: Diane Villa,  MRN: [id number], DOB/AGE: 1946-04-28 74 y.o.  Admit date: 09/25/2019 Discharge date: 09/26/2019  Primary Care Provider: Harlan Stains Primary Cardiologist: Quay Burow, MD  Discharge Diagnoses    Active Problems:   Peripheral arterial disease [facility])   Claudication in peripheral vascular disease ([place])   Allergies Allergies  Allergen Reactions   Ambien [Zolpidem Tartrate]    Sulfur     RASH ON ABDOMEN    Diagnostic Studies/Procedures    PV angiogram: 09/25/19  Final Impression: Successful Hawk 1 directional atherectomy 5 by drug-coated balloon angioplasty using spider distal protection for a short segment "CTO" with one-vessel runoff in the setting of lifestyle and claudication.  I believe this to small AV fistula will spontaneously seal once the ACT falls below 170 over time.  We will hydrate the patient overnight.  She received 300 mg of p.o. Plavix.  She will be discharged home the morning and will obtain lower extremity arterial Doppler studies in our [facility] line office next week.  I will see her back 1 to 2 weeks thereafter.  She left lab in stable condition.  Quay Burow. MD, [facility] _____________   History of Present Illness     74 y.o. female who was referred to Dr. Gwenlyn Found by Dr. Paulla Dolly, her podiatrist for right calf lifestyle limiting claudication. She is a retired Optometrist. Her only cardiovascular risk factors are treated hypertension and untreated hyperlipidemia. There is no family history.  She has never had a heart attack or  stroke. She exercises on a daily basis 70 to 100 minutes a day including playing tennis.  She had noted right calf claudication over the last several months which was lifestyle limiting.  Dopplers performed 09/05/2019 revealed a right ankle-brachial index of 0.55 and a left of 1.03 with an occluded right SFA. She was seen in the office with Dr. Gwenlyn Found and set up for outpatient PV angiogram.   Hospital Course     She presented for outpatient PV angiogram with Dr. Gwenlyn Found with successful [facility] directional atherectomy with drug coated balloon angioplasty using distal protection for a CTO of the mid right SFA. She was given '300mg'$  of plavix while in the PV lab and planned for ASA/plavix. Had an episode of bradycardia and hypotension post cath but improved with IVFs and 0.'5mg'$  atropine. Morning labs were stable. Able to ambulate without complications.    General: Well developed, well nourished, female appearing in no acute distress. Head: Normocephalic, atraumatic.  Neck: Supple without bruits, JVD. Lungs:  Resp regular and unlabored, CTA. Heart: RRR, S1, S2, no S3, S4, or murmur; no rub. Abdomen: Soft, non-tender, non-distended with normoactive bowel sounds. No hepatomegaly. No rebound/guarding. No obvious abdominal masses. Extremities: No clubbing, cyanosis, edema. Distal pedal pulses are 2+ bilaterally. Left femoral cath site stable mild bruising or hematoma Neuro: Alert and oriented X 3. Moves all extremities spontaneously. Psych: Normal affect.  Rush Barer was seen by Dr. Tamala Julian and determined stable for discharge home. Follow up in the office has been arranged. Medications are listed below.  _____________  Discharge Vitals Blood pressure (!) 141/57, pulse (!) 58, temperature 98.3 F (36.8 C), temperature source Oral, resp. rate 14, height 5' 1.5" (1.562 m), weight 58.5 kg, SpO2 98 %.  Filed Weights   09/25/19 0543 09/25/19 1312 09/26/19 0418  Weight: 58.1 kg 59.3 kg 58.5 kg    Labs &  Radiologic Studies    CBC Recent Labs    09/26/19 0440  WBC 6.2  HGB 12.6  HCT 38.8  MCV 92.2  PLT 700*   Basic Metabolic Panel Recent Labs    09/26/19 0440  NA 143  K 3.8  CL 112*  CO2 24  GLUCOSE 101*  BUN 12  CREATININE 0.93  CALCIUM 8.5*   Liver Function Tests No results for input(s): AST, ALT, ALKPHOS, BILITOT, PROT, ALBUMIN in the last 72 hours. No results for input(s): LIPASE, AMYLASE in the last 72 hours. Cardiac Enzymes No results for input(s): CKTOTAL, CKMB, CKMBINDEX, TROPONINI in the last 72 hours. BNP Invalid input(s): POCBNP D-Dimer No results for input(s): DDIMER in the last 72 hours. Hemoglobin A1C No results for input(s): HGBA1C in the last 72 hours. Fasting Lipid Panel No results for input(s): CHOL, HDL, LDLCALC, TRIG, CHOLHDL, LDLDIRECT in the last 72 hours. Thyroid Function Tests No results for input(s): TSH, T4TOTAL, T3FREE, THYROIDAB in the last 72 hours.  Invalid input(s): FREET3 _____________  PERIPHERAL VASCULAR CATHETERIZATION  Result Date: 09/25/2019  [id number] LOCATION:  FACILITY: [facility] PHYSICIAN: Quay Burow, M.D. Sep 25, 1945 DATE OF PROCEDURE:  09/25/2019 DATE OF DISCHARGE: PV Angiogram/Intervention History obtained from chart review.Diane Villa is a 74 y.o. thin and fit appearing widowed Caucasian female mother of 2 children, grandmother of 1 grandchild who was referred to me by Dr. Paulla Dolly, her podiatrist for right calf lifestyle limiting claudication.  She is a retired Optometrist.  Her only cardiovascular risk factors are treated hypertension and untreated hyperlipidemia.  There is no family history.  She is never had a heart attack or stroke.  She denies chest pain or shortness of breath.  She exercises on a daily basis 70 to 100 minutes a day including playing tennis.  She is noted right calf claudication over the last several months which is lifestyle limiting.  Dopplers performed 09/05/2019 revealed a right  ankle-brachial index of 0.55 and a left of 1.03 with an occluded right SFA.  The patient desires to proceed with angiography and endovascular therapy.  Pre Procedure Diagnosis: Peripheral arterial disease/claudication Post Procedure Diagnosis: Peripheral arterial disease/claudication Operators: Dr. Quay Burow Procedures Performed:  1.  Ultrasound-guided left common femoral access  2.  Abdominal aortogram/bilateral iliac angiogram/bifemoral runoff  3.  Contralateral access (second order catheter placement)  4.  Placement of a spider distal protection device right popliteal artery  5.  Hawk 1 directional atherectomy followed by drug-coated balloon angioplasty mid right SFA CTO  6.  Left common femoral angiogram, Mynx closure PROCEDURE DESCRIPTION: The patient was brought to the second floor [facility] lab in the the postabsorptive state. She was premedicated with IV Versed and fentanyl. Her left groin was prepped and shaved in usual sterile fashion. Xylocaine 1% was used for local anesthesia. A 5 French sheath was inserted into the left common femoral artery using standard Seldinger technique.  Ultrasound was used to identify the artery and guide access.  A digital image was captured and placed in the patient's chart.  A 5 French pigtail catheter was placed in the distal abdominal aorta.  Distal abdominal aortography,  bilateral iliac angiography with bifemoral runoff was performed using bolus chase, digital subtraction and step table technique.  Isovue dye was used for the entirety of the case.  Retrograde aortic pressures monitored in the case.  Angiographic Data: 1: Abdominal aorta-40% ostial right renal artery stenosis, the infrarenal abdominal aorta was free of atherosclerotic changes 2: Left lower extremity-30% focal mid left SFA stenosis with one-vessel runoff via the peroneal artery. 3: Right lower extremity-99% subtotally occluded mid right SFA stenosis with with one-vessel runoff via the  peroneal artery.   Ms. Coury has a subtotal occlusion short segment mid right SFA with one-vessel runoff responsible for her claudication.  We will proceed with directional arthrectomy followed by drug-coated balloon angioplasty using spider distal protection. Procedure Description: Contralateral access was obtained with a crossover catheter and a 7 French 55 cm multipurpose Ansell sheath.  The patient received 10,000 as of heparin with an ACT of 313.  Total of 200 cc of contrast was administered to the patient. I was able to cross the subtotal occlusion with a Viance CTO catheter and an 014 6 g Zilient wire.  Following this I was able to balloon the diseased area with a 2.5 mm x 60 mm long balloon and placed a 6 mm spider distal protection device in the P2 segment of the popliteal artery.  I then performed [facility] 1 directional atherectomy using LX device performing multiple circumferential cuts removing a copious amount of atherosclerotic plaque.  I then performed drug-coated balloon angioplasty with a 5 mm x 80 mm long impact Admiral balloon at 4 atm for 3 minutes resulting reduction of a subtotal occlusion to 0% residual.  There were 2 small areas of venous phase blood flow suggesting small AV fistulas which I suspect will spontaneously resolve once the ACT normalizes.  Importantly, there is no extravascular extravasation into the nonvascular space.  I performed an angiogram of the trifurcation demonstrating this to be intact and then captured and removed the spider distal protection device.  I exchanged the Ansell sheath over an 035 wire for a short 7 French sheath, performed a left common femoral angiogram and successfully deployed a 6/7 Mynx closure device achieving hemostasis. Final Impression: Successful Hawk 1 directional atherectomy 5 by drug-coated balloon angioplasty using spider distal protection for a short segment "CTO" with one-vessel runoff in the setting of lifestyle and claudication.  I believe  this to small AV fistula will spontaneously seal once the ACT falls below 170 over time.  We will hydrate the patient overnight.  She received 300 mg of p.o. Plavix.  She will be discharged home the morning and will obtain lower extremity arterial Doppler studies in our [facility] line office next week.  I will see her back 1 to 2 weeks thereafter.  She left lab in stable condition. Quay Burow. MD, [facility] 09/25/2019 9:30 AM   DG Foot 2 Views Left  Result Date: 08/30/2019 Please see detailed radiograph report in office note.  DG Foot 2 Views Right  Result Date: 08/30/2019 Please see detailed radiograph report in office note.  VAS [place] LE ART SEG MULTI (Segm&LE Reynauds)  Result Date: 09/07/2019 LOWER EXTREMITY DOPPLER STUDY Indications: Claudication. Patient referred from Podiatry for diminished pulses              on the right. Patient reports that she is quite active and walks              about 40 minutes a day, but lately she has been noticing  that her              right calf will start to cramp while walking. It is relieved with a              little bit of rest, for example stopping to talk with neighbors              along her walk. She also reports some buttock and sacro-iliac joint              pain. High Risk Factors: No history of smoking.  Performing Technologist: Mariane Masters RVT  Examination Guidelines: A complete evaluation includes at minimum, Doppler waveform signals and systolic blood pressure reading at the level of bilateral brachial, anterior tibial, and posterior tibial arteries, when vessel segments are accessible. Bilateral testing is considered an integral part of a complete examination. Photoelectric Plethysmograph (PPG) waveforms and toe systolic pressure readings are included as required and additional duplex testing as needed. Limited examinations for reoccurring indications may be performed as noted.  ABI Findings: +---------+------------------+-----+----------+---------+  Right      Rt Pressure (mmHg) Index Waveform   Comment    +---------+------------------+-----+----------+---------+  Brachial  206                                            +---------+------------------+-----+----------+---------+  CFA                                biphasic              +---------+------------------+-----+----------+---------+  Popliteal                          monophasic            +---------+------------------+-----+----------+---------+  ATA                                absent     inaudible  +---------+------------------+-----+----------+---------+  PTA       114                0.55  monophasic            +---------+------------------+-----+----------+---------+  PERO      113                0.54  monophasic            +---------+------------------+-----+----------+---------+  Great Toe 65                 0.31  Abnormal              +---------+------------------+-----+----------+---------+ +---------+------------------+-----+---------+-------+  Left      Lt Pressure (mmHg) Index Waveform  Comment  +---------+------------------+-----+---------+-------+  Brachial  208                                         +---------+------------------+-----+---------+-------+  CFA                                triphasic          +---------+------------------+-----+---------+-------+  Popliteal  triphasic          +---------+------------------+-----+---------+-------+  ATA       154                0.74  biphasic           +---------+------------------+-----+---------+-------+  PTA       214                1.03  biphasic           +---------+------------------+-----+---------+-------+  PERO      203                0.98  triphasic          +---------+------------------+-----+---------+-------+  Great Toe 135                0.65  Normal             +---------+------------------+-----+---------+-------+ +-------+-----------+-----------+------------+------------+  ABI/TBI Today's ABI Today's  TBI Previous ABI Previous TBI  +-------+-----------+-----------+------------+------------+  Right   0.55        0.31                                   +-------+-----------+-----------+------------+------------+  Left    1.03        0.65                                   +-------+-----------+-----------+------------+------------+ TOES Findings: +----------+---------------+--------+-------+  Right Toes Pressure (mmHg) Waveform Comment  +----------+---------------+--------+-------+  1st Digit                  Abnormal          +----------+---------------+--------+-------+  2nd Digit                  Abnormal          +----------+---------------+--------+-------+  3rd Digit                  Abnormal          +----------+---------------+--------+-------+  4th Digit                  Abnormal          +----------+---------------+--------+-------+  5th Digit                  Abnormal          +----------+---------------+--------+-------+  +---------+---------------+--------+-------+  Left Toes Pressure (mmHg) Waveform Comment  +---------+---------------+--------+-------+  1st Digit                 Normal            +---------+---------------+--------+-------+  2nd Digit                 Abnormal          +---------+---------------+--------+-------+  3rd Digit                 Abnormal          +---------+---------------+--------+-------+  4th Digit                 Abnormal          +---------+---------------+--------+-------+  5th Digit                 Abnormal          +---------+---------------+--------+-------+  Summary: Right: Resting right ankle-brachial index indicates moderate right lower extremity arterial disease. The right toe-brachial index is abnormal. Left: Resting left ankle-brachial index is within normal range. No evidence of significant left lower extremity arterial disease. The left toe-brachial index is abnormal.  *See table(s) above for measurements and observations. See arterial duplex report.  Vascular  consult recommended. Patient taken to check out and scheduled to see Dr. Gwenlyn Found 09/12/19. Electronically signed by Jenkins Rouge MD on 09/07/2019 at 8:34:04 AM.    Final    VAS [place] LOWER EXTREMITY ARTERIAL DUPLEX  Result Date: 09/07/2019 LOWER EXTREMITY ARTERIAL DUPLEX STUDY Indications: Claudication, and Patient referred from Podiatry for diminished              pulses on the right. Patient reports that she is quite active and              walks about 40 minutes a day, but lately she has been noticing that              her right calf will start to cramp while walking. It is relieved              with a little bit of rest, for example stopping to talk with              neighbors along her walk. She also reports some buttock and              sacro-iliac joint pain. High Risk Factors: No history of smoking.  Current ABI: Right- 0.55 Left- 1.03 Performing Technologist: Mariane Masters RVT  Examination Guidelines: A complete evaluation includes B-mode imaging, spectral Doppler, color Doppler, and power Doppler as needed of all accessible portions of each vessel. Bilateral testing is considered an integral part of a complete examination. Limited examinations for reoccurring indications may be performed as noted. Aorta: +--------+-------+----------+----------+--------+--------+-----+           AP (cm) Trans (cm) PSV (cm/s) Waveform Thrombus Shape  +--------+-------+----------+----------+--------+--------+-----+  Proximal         2.00       113        biphasic                 +--------+-------+----------+----------+--------+--------+-----+  Mid      1.40    1.40       66         biphasic                 +--------+-------+----------+----------+--------+--------+-----+  Distal   1.50    1.50       118        biphasic                 +--------+-------+----------+----------+--------+--------+-----+   +-----------+--------+-----+---------------+----------------+------------------+  RIGHT       PSV cm/s Ratio Stenosis         Waveform         Comments            +-----------+--------+-----+---------------+----------------+------------------+  CIA Prox    122                            biphasic                             +-----------+--------+-----+---------------+----------------+------------------+  CIA Mid     177  biphasic                             +-----------+--------+-----+---------------+----------------+------------------+  CIA Distal  136                            biphasic                             +-----------+--------+-----+---------------+----------------+------------------+  EIA Prox    151                            biphasic                             +-----------+--------+-----+---------------+----------------+------------------+  EIA Mid     152                            biphasic                             +-----------+--------+-----+---------------+----------------+------------------+  EIA Distal  128                            biphasic                             +-----------+--------+-----+---------------+----------------+------------------+  CFA Prox    165            30-49% stenosis nearly triphasic plaque              +-----------+--------+-----+---------------+----------------+------------------+  DFA         149                            triphasic                            +-----------+--------+-----+---------------+----------------+------------------+  SFA Prox    57                             biphasic                             +-----------+--------+-----+---------------+----------------+------------------+  SFA Mid     31                             monophasic       resistant           +-----------+--------+-----+---------------+----------------+------------------+  SFA Distal  0              occluded                         plaque, no flow  detected             +-----------+--------+-----+---------------+----------------+------------------+  POP Prox    24                             monophasic                           +-----------+--------+-----+---------------+----------------+------------------+  POP Distal  31                             monophasic                           +-----------+--------+-----+---------------+----------------+------------------+  TP Trunk    28                             monophasic                           +-----------+--------+-----+---------------+----------------+------------------+  ATA Distal  0              occluded                         no flow detected    +-----------+--------+-----+---------------+----------------+------------------+  PTA Distal  0              occluded                         no flow detected    +-----------+--------+-----+---------------+----------------+------------------+  PERO Distal 25                             monophasic                           +-----------+--------+-----+---------------+----------------+------------------+  +-----------+--------+-----+--------+---------+-------------------------+  LEFT        PSV cm/s Ratio Stenosis Waveform  Comments                   +-----------+--------+-----+--------+---------+-------------------------+  CIA Prox    122                     biphasic                             +-----------+--------+-----+--------+---------+-------------------------+  CIA Mid                                       not visualized, bowel gas  +-----------+--------+-----+--------+---------+-------------------------+  CIA Distal  101                     biphasic                             +-----------+--------+-----+--------+---------+-------------------------+  EIA Prox    125                     biphasic                             +-----------+--------+-----+--------+---------+-------------------------+  EIA Mid     137                     biphasic                              +-----------+--------+-----+--------+---------+-------------------------+  EIA Distal  146                     biphasic                             +-----------+--------+-----+--------+---------+-------------------------+  CFA Prox    201                     biphasic                             +-----------+--------+-----+--------+---------+-------------------------+  DFA         129                     triphasic                            +-----------+--------+-----+--------+---------+-------------------------+  SFA Prox    132                     biphasic                             +-----------+--------+-----+--------+---------+-------------------------+  SFA Mid     148                     biphasic                             +-----------+--------+-----+--------+---------+-------------------------+  SFA Distal  89                      biphasic                             +-----------+--------+-----+--------+---------+-------------------------+  POP Prox    75                      biphasic                             +-----------+--------+-----+--------+---------+-------------------------+  POP Distal  69                      biphasic                             +-----------+--------+-----+--------+---------+-------------------------+  TP Trunk    83                      biphasic                             +-----------+--------+-----+--------+---------+-------------------------+  ATA Distal  105                     biphasic                             +-----------+--------+-----+--------+---------+-------------------------+  PTA Distal  36                      biphasic                             +-----------+--------+-----+--------+---------+-------------------------+  PERO Distal 49                      biphasic                             +-----------+--------+-----+--------+---------+-------------------------+  Summary: Aorta-Iliac: Atherosclerosis without evidence of focal stenosis.  Right: Total occlusion  noted in the distal superficial femoral artery, with reconstituted flow. Total occlusion noted in the anterior tibial artery. Suspected occlusion of the posterior tibial artery. Left: Atherosclerosis without evidence of focal stenosis. Near normal examination.  See table(s) above for measurements and observations. See arterial Doppler report. Vascular consult recommended. Patient scheduled with Dr. Gwenlyn Found 09/12/19. Electronically signed by Jenkins Rouge MD on 09/07/2019 at 8:40:16 AM.    Final    Disposition   Pt is being discharged home today in good condition.  Follow-up Plans & Appointments    Follow-up Information    Lorretta Harp, MD Follow up on 10/17/2019.   Specialties: Cardiology, Radiology Why: at 8:30am for your follow up appt.  Contact information: [address] [place] [address] [phone]        [facility] Follow up on 09/29/2019.   Specialty: Cardiology Why: at 2pm for your follow up appt. Contact information: [address] [place] [phone]          Discharge Medications     Medication List    TAKE these medications   ALPRAZolam 0.5 MG tablet Commonly known as: XANAX Take 0.25 mg by mouth 3 (three) times daily as needed for anxiety.   amLODipine 2.5 MG tablet Commonly known as: NORVASC Take 2.5 mg by mouth at bedtime.   aspirin 81 MG EC tablet Take 1 tablet (81 mg total) by mouth daily. Start taking on: September 27, 2019   atenolol 100 MG tablet Commonly known as: TENORMIN Take 100 mg by mouth at bedtime.   atorvastatin 80 MG tablet Commonly known as: LIPITOR Take 1 tablet (80 mg total) by mouth daily.   cholecalciferol 1000 units tablet Commonly known as: VITAMIN D Take 1,000 Units by mouth daily.   clopidogrel 75 MG tablet Commonly known as: PLAVIX Take 1 tablet (75 mg total) by mouth daily with breakfast. Start taking on: September 27, 2019   losartan 100 MG  tablet Commonly known as: COZAAR Take 100 mg by mouth at bedtime.   Systane Complete 0.6 % Soln Generic drug: Propylene Glycol Place 1 drop into both eyes 3 (three) times daily as needed (dry/irritated eyes.).       No                               Did the patient have a percutaneous coronary intervention (stent / angioplasty)?:  No.      Outstanding Labs/Studies   Follow up dopplers  Duration of Discharge Encounter   Greater than 30 minutes including physician time.  Signed, Reino Bellis NP-C 09/26/2019, 9:40 AM

## 2019-09-27 ENCOUNTER — Telehealth: Payer: Self-pay | Admitting: Cardiovascular Disease

## 2019-09-27 NOTE — Telephone Encounter (Signed)
Patient called and said she has a patch near the bandage from her procedure on Monday. She says it's about the size of a quarter and it looks like a blister may be forming. She just wanted to know if it was something she should be concerned about before her appointment on Friday for her other vascular scan.

## 2019-09-27 NOTE — Telephone Encounter (Signed)
Patient has femoral cath site and then she has a large area of clear plastic bandage on her left groin/abd area from her procedure on Monday 09/25/19.  She does not see a second puncture site under the plastic but was instructed not to remove this clear bandage.  At the edge of the clear bandage is a blister.  It is itchy.  Does not hurt. Barely under the plastic.  She lifted the edge of the bandage up and the blister broke- clear, pink tinged fluid came out and blister is down now.  She will monitor for any other areas.   Pt aware I will route to Dr. Allyson Sabal for when the bandage can be removed.

## 2019-09-28 NOTE — Telephone Encounter (Signed)
It's fine for her to remove the bandage now. There was only one puncture site

## 2019-09-28 NOTE — Telephone Encounter (Signed)
Pt informed of providers result & recommendations. Pt verbalized understanding. No further questions. She has appt tomorrow will discuss then

## 2019-09-29 ENCOUNTER — Other Ambulatory Visit: Payer: Self-pay

## 2019-09-29 ENCOUNTER — Other Ambulatory Visit (HOSPITAL_COMMUNITY): Payer: Self-pay | Admitting: Cardiovascular Disease

## 2019-09-29 ENCOUNTER — Ambulatory Visit (HOSPITAL_COMMUNITY)
Admission: RE | Admit: 2019-09-29 | Discharge: 2019-09-29 | Disposition: A | Discharge status: Home / Self Care | Payer: Medicare Other | Source: Ambulatory Visit | Attending: Cardiovascular Disease | Admitting: Cardiovascular Disease

## 2019-09-29 DIAGNOSIS — I739 Peripheral vascular disease, unspecified: Secondary | ICD-10-CM | POA: Insufficient documentation

## 2019-09-29 DIAGNOSIS — Z9862 Peripheral vascular angioplasty status: Secondary | ICD-10-CM

## 2019-10-06 ENCOUNTER — Telehealth: Payer: Self-pay | Admitting: Cardiovascular Disease

## 2019-10-06 NOTE — Telephone Encounter (Signed)
Spoke with patient. Patient noted her toilet paper was pink this morning and when she looked in the toilet she could tell there was some blood. Patient reports she had a UTI a year ago just as additional information.   Patient started on Plavix and Aspirin on 4/19 after a procedure.   Spoke with Raquel, Pharm D. Patient advised to go to PCP or urgent care. If gross hematuria patient should seek care at the ER. Patient advised to go to PCP for urinalysis and CBC.   Patient understands instructions and will contact PCP

## 2019-10-06 NOTE — Telephone Encounter (Signed)
Pt c/o medication issue:  1. Name of Medication: clopidogrel (PLAVIX) 75 MG tablet  2. How are you currently taking this medication (dosage and times per day)? As directed   3. Are you having a reaction (difficulty breathing--STAT)? Blood in urine  4. What is your medication issue? Pt has been on this medication since 04-19 and noticed blood in her urine for the first time today. Pt wanted to know what to do.  Pt may be out walking so please try the cell phone if you can not reach her

## 2019-10-17 ENCOUNTER — Other Ambulatory Visit: Payer: Self-pay | Admitting: Family Medicine

## 2019-10-17 ENCOUNTER — Other Ambulatory Visit: Payer: Self-pay

## 2019-10-17 ENCOUNTER — Ambulatory Visit: Payer: Medicare Other | Admitting: Cardiovascular Disease

## 2019-10-17 ENCOUNTER — Encounter: Payer: Self-pay | Admitting: Cardiovascular Disease

## 2019-10-17 VITALS — BP 132/70 | HR 66 | Temp 98.1°F | Ht 61.5 in | Wt 126.8 lb

## 2019-10-17 DIAGNOSIS — I739 Peripheral vascular disease, unspecified: Secondary | ICD-10-CM

## 2019-10-17 DIAGNOSIS — I10 Essential (primary) hypertension: Secondary | ICD-10-CM

## 2019-10-17 DIAGNOSIS — Z136 Encounter for screening for cardiovascular disorders: Secondary | ICD-10-CM

## 2019-10-17 DIAGNOSIS — E782 Mixed hyperlipidemia: Secondary | ICD-10-CM

## 2019-10-17 DIAGNOSIS — Z1231 Encounter for screening mammogram for malignant neoplasm of breast: Secondary | ICD-10-CM

## 2019-10-17 NOTE — Assessment & Plan Note (Signed)
History of PAD status post recent right SFA Hawk 1 atherectomy and drug-coated balloon angioplasty reducing a 99% pre-CTO lesion in the mid right SFA to 0% residual.  She did have one-vessel runoff bilaterally via peroneal.  She did have some ecchymosis and bruising in her left groin which is since resolved.  Her claudication has resolved as well and her Dopplers have normalized.  She remains on aspirin and Plavix.  I released her to go back to play tennis.

## 2019-10-17 NOTE — Patient Instructions (Addendum)
Medication Instructions:  NO CHANGE  *If you need a refill on your cardiac medications before your next appointment, please call your pharmacy*   Lab Work: Your physician recommends that you return for lab work in ONE MONTH PRIOR TO EATING  If you have labs (blood work) drawn today and your tests are completely normal, you will receive your results only by: Marland Kitchen MyChart Message (if you have MyChart) OR . A paper copy in the mail If you have any lab test that is abnormal or we need to change your treatment, we will call you to review the results.   Testing/Procedures: CORONARY CALCIUM SCORE AT [address]  Your physician has requested that you have a lower extremity arterial duplex. This test is an ultrasound of the arteries in the legs or arms. It looks at arterial blood flow in the legs and arms. Allow one hour for Lower and Upper Arterial scans. There are no restrictions or special instructions SCHEDULE IN 6 MONTHS   Follow-Up: At [facility], you and your health needs are our priority.  As part of our continuing mission to provide you with exceptional heart care, we have created designated Provider Care Teams.  These Care Teams include your primary Cardiologist (physician) and Advanced Practice Providers (APPs -  Physician Assistants and Nurse Practitioners) who all work together to provide you with the care you need, when you need it.  We recommend signing up for the patient portal called "MyChart".  Sign up information is provided on this After Visit Summary.  MyChart is used to connect with patients for Virtual Visits (Telemedicine).  Patients are able to view lab/test results, encounter notes, upcoming appointments, etc.  Non-urgent messages can be sent to your provider as well.   To learn more about what you can do with MyChart, go to [email or web address].    Your next appointment:   6 month(s)  The format for your next appointment:   Either In Person or  Virtual  Provider:   You may see Nanetta Batty, MD or one of the following Advanced Practice Providers on your designated Care Team:    Corine Shelter, PA-C  [place], [place]  Edd Fabian, [place]    +

## 2019-10-17 NOTE — Progress Notes (Signed)
10/17/2019 [name]   [birth year]  [id number]  Primary Physician Harlan Stains, MD Primary Cardiologist: Lorretta Harp MD [name], [place]  HPI:  Diane Villa is a 74 y.o.  thin and fit appearing widowed Caucasian female mother of 2 children, grandmother of 1 grandchild who was referred to me by Dr. Paulla Dolly, her podiatrist for right calf lifestyle limiting claudication.  I last saw her in the office 09/12/2019. She is a retired Optometrist.  Her only cardiovascular risk factors are treated hypertension and untreated hyperlipidemia.  There is no family history.  She is never had a heart attack or stroke.  She denies chest pain or shortness of breath.  She exercises on a daily basis 70 to 100 minutes a day including playing tennis.  She is noted right calf claudication over the last several months which is lifestyle limiting.  Dopplers performed 09/05/2019 revealed a right ankle-brachial index of 0.55 and a left of 1.03 with an occluded right SFA.    I performed peripheral angiography on her 09/25/2019 revealing subtotally occluded short segment pre-CTO mid right SFA with one-vessel runoff bilaterally via the peroneal artery.  I performed [facility] 1 directional atherectomy followed by drug-coated balloon angioplasty with excellent angiographic result.  Her claudication has resolved.  Her Dopplers have normalized.  She did have some ecchymosis in her left groin and suprapubic pubic area which tracked down the medial aspect of her left medial thigh and has since disappeared.  She denies chest pain or shortness of breath.  I did start her on a statin drug.   Current Meds  Medication Sig  . ALPRAZolam (XANAX) 0.5 MG tablet Take 0.25 mg by mouth 3 (three) times daily as needed for anxiety.   Marland Kitchen amLODipine (NORVASC) 2.5 MG tablet Take 2.5 mg by mouth at bedtime.   Marland Kitchen aspirin EC 81 MG EC tablet Take 1 tablet (81 mg total) by mouth daily.  Marland Kitchen atenolol (TENORMIN) 100 MG tablet Take  100 mg by mouth at bedtime.   Marland Kitchen atorvastatin (LIPITOR) 80 MG tablet Take 1 tablet (80 mg total) by mouth daily.  . cholecalciferol (VITAMIN D) 1000 UNITS tablet Take 1,000 Units by mouth daily.  . clopidogrel (PLAVIX) 75 MG tablet Take 1 tablet (75 mg total) by mouth daily with breakfast.  . losartan (COZAAR) 100 MG tablet Take 100 mg by mouth at bedtime.   Marland Kitchen Propylene Glycol (SYSTANE COMPLETE) 0.6 % SOLN Place 1 drop into both eyes 3 (three) times daily as needed (dry/irritated eyes.).     Allergies  Allergen Reactions  . Ambien [Zolpidem Tartrate]   . Sulfur     RASH ON ABDOMEN    Social History   Socioeconomic History  . Marital status: Widowed    Spouse name: Not on file  . Number of children: 2  . Years of education: Not on file  . Highest education level: Not on file  Occupational History  . Occupation: retired    Comment: accounts payable  Tobacco Use  . Smoking status: Never Smoker  . Smokeless tobacco: Never Used  Substance and Sexual Activity  . Alcohol use: No  . Drug use: No  . Sexual activity: Not on file  Other Topics Concern  . Not on file  Social History Narrative  . Not on file   Social Determinants of Health   Financial Resource Strain:   . Difficulty of Paying Living Expenses:   Food Insecurity:   .  Worried About Programme researcher, broadcasting/film/video in the Last Year:   . Barista in the Last Year:   Transportation Needs:   . Freight forwarder (Medical):   Marland Kitchen Lack of Transportation (Non-Medical):   Physical Activity:   . Days of Exercise per Week:   . Minutes of Exercise per Session:   Stress:   . Feeling of Stress :   Social Connections:   . Frequency of Communication with Friends and Family:   . Frequency of Social Gatherings with Friends and Family:   . Attends Religious Services:   . Active Member of Clubs or Organizations:   . Attends Banker Meetings:   Marland Kitchen Marital Status:   Intimate Partner Violence:   . Fear of Current or  Ex-Partner:   . Emotionally Abused:   Marland Kitchen Physically Abused:   . Sexually Abused:      Review of Systems: General: negative for chills, fever, night sweats or weight changes.  Cardiovascular: negative for chest pain, dyspnea on exertion, edema, orthopnea, palpitations, paroxysmal nocturnal dyspnea or shortness of breath Dermatological: negative for rash Respiratory: negative for cough or wheezing Urologic: negative for hematuria Abdominal: negative for nausea, vomiting, diarrhea, bright red blood per rectum, melena, or hematemesis Neurologic: negative for visual changes, syncope, or dizziness All other systems reviewed and are otherwise negative except as noted above.    Blood pressure 132/70, pulse 66, temperature 98.1 F (36.7 C), height 5' 1.5" (1.562 m), weight 126 lb 12.8 oz (57.5 kg), SpO2 98 %.  General appearance: alert and no distress Neck: no adenopathy, no carotid bruit, no JVD, supple, symmetrical, trachea midline and thyroid not enlarged, symmetric, no tenderness/mass/nodules Lungs: clear to auscultation bilaterally Heart: regular rate and rhythm, S1, S2 normal, no murmur, click, rub or gallop Extremities: extremities normal, atraumatic, no cyanosis or edema Pulses: 2+ and symmetric Skin: Skin color, texture, turgor normal. No rashes or lesions Neurologic: Alert and oriented X 3, normal strength and tone. Normal symmetric reflexes. Normal coordination and gait  EKG not performed today  ASSESSMENT AND PLAN:   Peripheral arterial disease (HCC) History of PAD status post recent right SFA Hawk 1 atherectomy and drug-coated balloon angioplasty reducing a 99% pre-CTO lesion in the mid right SFA to 0% residual.  She did have one-vessel runoff bilaterally via peroneal.  She did have some ecchymosis and bruising in her left groin which is since resolved.  Her claudication has resolved as well and her Dopplers have normalized.  She remains on aspirin and Plavix.  I released her to  go back to play tennis.  Essential hypertension History of essential hypertension blood pressure measured today 140/80.  She is on amlodipine, atenolol and losartan.  Hyperlipidemia History of hyperlipidemia recently started on statin therapy approxi-3 weeks ago.  We will recheck a lipid and liver profile in approximately a month.  Her last LDL was 120 back in September 2020.      Runell Gess MD FACP,FACC,FAHA, [facility] 10/17/2019 8:49 AM

## 2019-10-17 NOTE — Assessment & Plan Note (Signed)
History of essential hypertension blood pressure measured today 140/80.  She is on amlodipine, atenolol and losartan.

## 2019-10-17 NOTE — Assessment & Plan Note (Signed)
History of hyperlipidemia recently started on statin therapy approxi-3 weeks ago.  We will recheck a lipid and liver profile in approximately a month.  Her last LDL was 120 back in September 2020.

## 2019-10-27 ENCOUNTER — Ambulatory Visit (INDEPENDENT_AMBULATORY_CARE_PROVIDER_SITE_OTHER)
Admission: RE | Admit: 2019-10-27 | Discharge: 2019-10-27 | Disposition: A | Discharge status: Home / Self Care | Payer: Self-pay | Source: Ambulatory Visit | Attending: Cardiovascular Disease | Admitting: Cardiovascular Disease

## 2019-10-27 ENCOUNTER — Other Ambulatory Visit: Payer: Self-pay

## 2019-10-27 DIAGNOSIS — Z136 Encounter for screening for cardiovascular disorders: Secondary | ICD-10-CM

## 2019-11-15 LAB — HEPATIC FUNCTION PANEL
ALT: 26 IU/L (ref 0–32)
AST: 27 IU/L (ref 0–40)
Albumin: 4.3 g/dL (ref 3.7–4.7)
Alkaline Phosphatase: 91 IU/L (ref 48–121)
Bilirubin Total: 0.5 mg/dL (ref 0.0–1.2)
Bilirubin, Direct: 0.2 mg/dL (ref 0.00–0.40)
Total Protein: 6.8 g/dL (ref 6.0–8.5)

## 2019-11-15 LAB — LIPID PANEL
Chol/HDL Ratio: 2.2 ratio (ref 0.0–4.4)
Cholesterol, Total: 105 mg/dL (ref 100–199)
HDL: 47 mg/dL (ref >39)
LDL Chol Calc (NIH): 41 mg/dL (ref 0–99)
Triglycerides: 85 mg/dL (ref 0–149)
VLDL Cholesterol Cal: 17 mg/dL (ref 5–40)

## 2019-11-17 ENCOUNTER — Telehealth: Payer: Self-pay | Admitting: Cardiovascular Disease

## 2019-11-17 NOTE — Telephone Encounter (Signed)
New Message  Patient is calling in to go over her cholesterol lab results. Please give patient a call back to discuss.

## 2019-11-17 NOTE — Telephone Encounter (Signed)
Given her Lipid results and has been watching her diet.

## 2019-11-21 ENCOUNTER — Ambulatory Visit
Admission: RE | Admit: 2019-11-21 | Discharge: 2019-11-21 | Disposition: A | Discharge status: Home / Self Care | Payer: Medicare Other | Source: Ambulatory Visit

## 2019-11-21 ENCOUNTER — Other Ambulatory Visit: Payer: Self-pay

## 2019-11-21 DIAGNOSIS — Z1231 Encounter for screening mammogram for malignant neoplasm of breast: Secondary | ICD-10-CM

## 2020-01-22 ENCOUNTER — Other Ambulatory Visit: Payer: Self-pay | Admitting: Family Medicine

## 2020-01-22 DIAGNOSIS — M8580 Other specified disorders of bone density and structure, unspecified site: Secondary | ICD-10-CM

## 2020-03-21 ENCOUNTER — Other Ambulatory Visit: Payer: Self-pay

## 2020-03-21 ENCOUNTER — Encounter: Payer: Self-pay | Admitting: Podiatry

## 2020-03-21 ENCOUNTER — Ambulatory Visit: Payer: Medicare Other | Admitting: Podiatry

## 2020-03-21 DIAGNOSIS — M779 Enthesopathy, unspecified: Secondary | ICD-10-CM | POA: Diagnosis not present

## 2020-03-21 DIAGNOSIS — B351 Tinea unguium: Secondary | ICD-10-CM | POA: Diagnosis not present

## 2020-03-21 DIAGNOSIS — I999 Unspecified disorder of circulatory system: Secondary | ICD-10-CM

## 2020-03-22 NOTE — Progress Notes (Signed)
Subjective:   Patient ID: Diane Villa, female   DOB: 74 y.o.   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  Also has vascular disease that she wants checked and is very thankful that I sent her to a vascular doctor and cardiologist     Assessment:  Hallux limitus deformity with inflammatory changes first MPJ with vascular disease with stent that was put in earlier this year right     Plan:  H&P reviewed both conditions.  As far as vascular disease go she does have significant improvement in her circulatory status with pulses that are palpable and present and has much less leg pain.  She does have arthritis of the big toe joint with inflammation I did do sterile prep and injected around the joint 3 mg Kenalog 5 mg Xylocaine which can be done as needed

## 2020-04-17 ENCOUNTER — Ambulatory Visit (HOSPITAL_COMMUNITY)
Admission: RE | Admit: 2020-04-17 | Discharge: 2020-04-17 | Disposition: A | Discharge status: Home / Self Care | Payer: Medicare Other | Source: Ambulatory Visit | Attending: Internal Medicine | Admitting: Internal Medicine

## 2020-04-17 ENCOUNTER — Other Ambulatory Visit (HOSPITAL_COMMUNITY): Payer: Self-pay | Admitting: Cardiovascular Disease

## 2020-04-17 DIAGNOSIS — I739 Peripheral vascular disease, unspecified: Secondary | ICD-10-CM | POA: Insufficient documentation

## 2020-04-17 DIAGNOSIS — Z9862 Peripheral vascular angioplasty status: Secondary | ICD-10-CM | POA: Insufficient documentation

## 2020-04-18 DIAGNOSIS — E782 Mixed hyperlipidemia: Secondary | ICD-10-CM

## 2020-04-23 ENCOUNTER — Other Ambulatory Visit: Payer: Self-pay

## 2020-04-23 DIAGNOSIS — I739 Peripheral vascular disease, unspecified: Secondary | ICD-10-CM

## 2020-04-29 ENCOUNTER — Other Ambulatory Visit: Payer: Self-pay

## 2020-04-29 ENCOUNTER — Ambulatory Visit
Admission: RE | Admit: 2020-04-29 | Discharge: 2020-04-29 | Disposition: A | Discharge status: Home / Self Care | Payer: Medicare Other | Source: Ambulatory Visit | Attending: Family Medicine | Admitting: Family Medicine

## 2020-04-29 DIAGNOSIS — M8580 Other specified disorders of bone density and structure, unspecified site: Secondary | ICD-10-CM

## 2020-05-06 MED ORDER — ATORVASTATIN CALCIUM 80 MG PO TABS
40.0000 mg | ORAL_TABLET | Freq: Every day | ORAL | 11 refills | Status: DC
Start: 2020-05-06 — End: 2020-07-15

## 2020-07-09 DIAGNOSIS — M8580 Other specified disorders of bone density and structure, unspecified site: Secondary | ICD-10-CM | POA: Diagnosis not present

## 2020-07-15 ENCOUNTER — Other Ambulatory Visit: Payer: Self-pay | Admitting: Cardiology

## 2020-07-23 DIAGNOSIS — I10 Essential (primary) hypertension: Secondary | ICD-10-CM | POA: Diagnosis not present

## 2020-07-23 DIAGNOSIS — E785 Hyperlipidemia, unspecified: Secondary | ICD-10-CM | POA: Diagnosis not present

## 2020-07-23 DIAGNOSIS — M810 Age-related osteoporosis without current pathological fracture: Secondary | ICD-10-CM | POA: Diagnosis not present

## 2020-07-24 ENCOUNTER — Other Ambulatory Visit: Payer: Self-pay

## 2020-07-24 DIAGNOSIS — E782 Mixed hyperlipidemia: Secondary | ICD-10-CM | POA: Diagnosis not present

## 2020-07-24 LAB — LIPID PANEL
Chol/HDL Ratio: 2 ratio (ref 0.0–4.4)
Cholesterol, Total: 116 mg/dL (ref 100–199)
HDL: 58 mg/dL (ref >39)
LDL Chol Calc (NIH): 43 mg/dL (ref 0–99)
Triglycerides: 76 mg/dL (ref 0–149)
VLDL Cholesterol Cal: 15 mg/dL (ref 5–40)

## 2020-09-26 DIAGNOSIS — E785 Hyperlipidemia, unspecified: Secondary | ICD-10-CM | POA: Diagnosis not present

## 2020-09-26 DIAGNOSIS — R1013 Epigastric pain: Secondary | ICD-10-CM | POA: Diagnosis not present

## 2020-09-26 DIAGNOSIS — R319 Hematuria, unspecified: Secondary | ICD-10-CM | POA: Diagnosis not present

## 2020-10-01 ENCOUNTER — Other Ambulatory Visit: Payer: Self-pay | Admitting: Family Medicine

## 2020-10-01 DIAGNOSIS — R1013 Epigastric pain: Secondary | ICD-10-CM

## 2020-10-01 DIAGNOSIS — R7401 Elevation of levels of liver transaminase levels: Secondary | ICD-10-CM

## 2020-10-02 ENCOUNTER — Telehealth: Payer: Self-pay | Admitting: Cardiovascular Disease

## 2020-10-02 ENCOUNTER — Other Ambulatory Visit: Payer: Self-pay | Admitting: Cardiology

## 2020-10-02 MED ORDER — ATORVASTATIN CALCIUM 40 MG PO TABS: 40.0000 mg | ORAL_TABLET | Freq: Every day | ORAL | 3 refills | Status: DC

## 2020-10-02 NOTE — Telephone Encounter (Signed)
Pt called asking for her Atoravastatin 40 mg to be called in: note from 04/2020:  Diane Villa,  Dr. Allyson Sabal reviewed your message and recommended the following:  --ok to discontinue Plavix but continue aspirin 81 mg daily --excellent cholesterol on atorvastatin 80 mg --ok to decrease atorvastatin to 40 mg daily and recheck labs in 2 months.     Pt asking if she should continue her ASA 81 mg due to recent information being put out in the news and social media.. I advised her to continue but will forward to Dr. Allyson Sabal for review.  (news report said that adults over 17 years of age should not be on ASA for cardiovascular benefit due to higher risk of bleeding).

## 2020-10-02 NOTE — Telephone Encounter (Signed)
Pt c/o medication issue:  1. Name of Medication: atorvastatin (LIPITOR) 80 MG tablet  2. How are you currently taking this medication (dosage and times per day)? 1/2 a tablet once a day   3. Are you having a reaction (difficulty breathing--STAT)? No  4. What is your medication issue? PT is calling requesting that Dr.Berry write another script for this medication.Instead of the 80 mg she wants the 40 mg she was previously on if possible.She also wants to get his opinion on the latest new that is out about baby aspirin,she is concerned should she still take it.Please Advise

## 2020-10-02 NOTE — Telephone Encounter (Signed)
Patient has vascular disease.  She needs to remain on aspirin.

## 2020-10-07 NOTE — Telephone Encounter (Signed)
Left message per her request to advised her that per Dr. Allyson Sabal She should continue taking her ASA 81 mg.

## 2020-10-09 ENCOUNTER — Other Ambulatory Visit: Payer: Self-pay | Admitting: Cardiology

## 2020-10-09 DIAGNOSIS — R7401 Elevation of levels of liver transaminase levels: Secondary | ICD-10-CM | POA: Diagnosis not present

## 2020-10-09 NOTE — Telephone Encounter (Signed)
This is Dr. Berry's pt 

## 2020-10-11 DIAGNOSIS — R350 Frequency of micturition: Secondary | ICD-10-CM | POA: Diagnosis not present

## 2020-10-11 DIAGNOSIS — R310 Gross hematuria: Secondary | ICD-10-CM | POA: Diagnosis not present

## 2020-10-17 ENCOUNTER — Ambulatory Visit
Admission: RE | Admit: 2020-10-17 | Discharge: 2020-10-17 | Disposition: A | Discharge status: Home / Self Care | Payer: Medicare Other | Source: Ambulatory Visit | Attending: Family Medicine | Admitting: Family Medicine

## 2020-10-17 DIAGNOSIS — R1013 Epigastric pain: Secondary | ICD-10-CM | POA: Diagnosis not present

## 2020-10-17 DIAGNOSIS — R7401 Elevation of levels of liver transaminase levels: Secondary | ICD-10-CM

## 2020-10-17 DIAGNOSIS — I700 Atherosclerosis of aorta: Secondary | ICD-10-CM | POA: Diagnosis not present

## 2020-10-22 ENCOUNTER — Other Ambulatory Visit: Payer: Self-pay | Admitting: Family Medicine

## 2020-10-22 DIAGNOSIS — Z1231 Encounter for screening mammogram for malignant neoplasm of breast: Secondary | ICD-10-CM

## 2020-10-23 DIAGNOSIS — R7401 Elevation of levels of liver transaminase levels: Secondary | ICD-10-CM | POA: Diagnosis not present

## 2020-10-25 DIAGNOSIS — R634 Abnormal weight loss: Secondary | ICD-10-CM | POA: Diagnosis not present

## 2020-10-25 DIAGNOSIS — R310 Gross hematuria: Secondary | ICD-10-CM | POA: Diagnosis not present

## 2020-11-13 ENCOUNTER — Encounter: Payer: Self-pay | Admitting: Podiatry

## 2020-11-13 ENCOUNTER — Ambulatory Visit: Payer: Medicare Other | Admitting: Podiatry

## 2020-11-13 ENCOUNTER — Other Ambulatory Visit: Payer: Self-pay

## 2020-11-13 DIAGNOSIS — M461 Sacroiliitis, not elsewhere classified: Secondary | ICD-10-CM | POA: Insufficient documentation

## 2020-11-13 DIAGNOSIS — M778 Other enthesopathies, not elsewhere classified: Secondary | ICD-10-CM | POA: Diagnosis not present

## 2020-11-13 DIAGNOSIS — K589 Irritable bowel syndrome without diarrhea: Secondary | ICD-10-CM | POA: Insufficient documentation

## 2020-11-13 DIAGNOSIS — E559 Vitamin D deficiency, unspecified: Secondary | ICD-10-CM | POA: Insufficient documentation

## 2020-11-13 DIAGNOSIS — M810 Age-related osteoporosis without current pathological fracture: Secondary | ICD-10-CM | POA: Insufficient documentation

## 2020-11-13 DIAGNOSIS — F419 Anxiety disorder, unspecified: Secondary | ICD-10-CM | POA: Insufficient documentation

## 2020-11-13 DIAGNOSIS — F5104 Psychophysiologic insomnia: Secondary | ICD-10-CM | POA: Insufficient documentation

## 2020-11-13 DIAGNOSIS — R3129 Other microscopic hematuria: Secondary | ICD-10-CM | POA: Insufficient documentation

## 2020-11-13 DIAGNOSIS — K219 Gastro-esophageal reflux disease without esophagitis: Secondary | ICD-10-CM | POA: Insufficient documentation

## 2020-11-13 MED ORDER — TRIAMCINOLONE ACETONIDE 10 MG/ML IJ SUSP
10.0000 mg | Freq: Once | INTRAMUSCULAR | Status: AC
  Administered 2020-11-13: 09:00:00 10 mg

## 2020-11-13 NOTE — Progress Notes (Signed)
Subjective:   Patient ID: Diane Villa, female   DOB: 75 y.o.   MRN: [id number]   HPI Patient presents stating she is developing a lot of pain around the big toe joint right and it did very well for around 8 months   ROS      Objective:  Physical Exam  Neurovascular status intact with inflammation pain of the first MPJ right with fluid buildup around the joint     Assessment:  Inflammatory capsulitis with fluid buildup around the first MPJ right     Plan:  H&P reviewed condition sterile prep injected around the first MPJ 3 mg Kenalog 5 mg Xylocaine advised on anti-inflammatories rigid bottom shoes reappoint as needed no indications of circulatory issues at this time

## 2020-11-20 DIAGNOSIS — R350 Frequency of micturition: Secondary | ICD-10-CM | POA: Diagnosis not present

## 2020-11-20 DIAGNOSIS — R310 Gross hematuria: Secondary | ICD-10-CM | POA: Diagnosis not present

## 2020-11-26 DIAGNOSIS — E785 Hyperlipidemia, unspecified: Secondary | ICD-10-CM | POA: Diagnosis not present

## 2020-11-26 DIAGNOSIS — R7401 Elevation of levels of liver transaminase levels: Secondary | ICD-10-CM | POA: Diagnosis not present

## 2020-12-02 DIAGNOSIS — M810 Age-related osteoporosis without current pathological fracture: Secondary | ICD-10-CM | POA: Diagnosis not present

## 2020-12-02 DIAGNOSIS — I10 Essential (primary) hypertension: Secondary | ICD-10-CM | POA: Diagnosis not present

## 2020-12-02 DIAGNOSIS — E785 Hyperlipidemia, unspecified: Secondary | ICD-10-CM | POA: Diagnosis not present

## 2020-12-02 DIAGNOSIS — K219 Gastro-esophageal reflux disease without esophagitis: Secondary | ICD-10-CM | POA: Diagnosis not present

## 2020-12-04 DIAGNOSIS — M461 Sacroiliitis, not elsewhere classified: Secondary | ICD-10-CM | POA: Diagnosis not present

## 2020-12-04 DIAGNOSIS — I10 Essential (primary) hypertension: Secondary | ICD-10-CM | POA: Diagnosis not present

## 2020-12-04 DIAGNOSIS — M810 Age-related osteoporosis without current pathological fracture: Secondary | ICD-10-CM | POA: Diagnosis not present

## 2020-12-04 DIAGNOSIS — M545 Low back pain, unspecified: Secondary | ICD-10-CM | POA: Diagnosis not present

## 2020-12-04 DIAGNOSIS — R7401 Elevation of levels of liver transaminase levels: Secondary | ICD-10-CM | POA: Diagnosis not present

## 2020-12-04 DIAGNOSIS — E785 Hyperlipidemia, unspecified: Secondary | ICD-10-CM | POA: Diagnosis not present

## 2020-12-13 ENCOUNTER — Other Ambulatory Visit: Payer: Self-pay

## 2020-12-13 ENCOUNTER — Ambulatory Visit
Admission: RE | Admit: 2020-12-13 | Discharge: 2020-12-13 | Disposition: A | Discharge status: Home / Self Care | Payer: Medicare Other | Source: Ambulatory Visit

## 2020-12-13 DIAGNOSIS — Z1231 Encounter for screening mammogram for malignant neoplasm of breast: Secondary | ICD-10-CM | POA: Diagnosis not present

## 2021-01-07 ENCOUNTER — Other Ambulatory Visit: Payer: Self-pay | Admitting: Cardiovascular Disease

## 2021-01-07 ENCOUNTER — Other Ambulatory Visit: Payer: Self-pay

## 2021-01-07 ENCOUNTER — Ambulatory Visit: Payer: Medicare Other | Admitting: Physician Assistant

## 2021-01-07 ENCOUNTER — Encounter: Payer: Self-pay | Admitting: Physician Assistant

## 2021-01-07 DIAGNOSIS — I776 Arteritis, unspecified: Secondary | ICD-10-CM | POA: Diagnosis not present

## 2021-01-07 NOTE — Progress Notes (Signed)
   Follow-Up Visit   Subjective  Diane Villa is a 75 y.o. female who presents for the following: Rash: Red Rash on both lower legs and some on the upper part. Patient says there is some on her upper arm. She said at first it itched and burned. Now nothing. This started Friday night, it has gotten worse. Patient says the only different thing she did was eat Cottage cheese. She has never eaten that. She does have a history of this happening 15 years ago. They never found out the cause and it self resolved. Patient did get prolia injection in august and has had lots of GI side effects. She isn't sure if that could be the cause. Treatment hydrocortisone cream. Has not been effective. She had extensive blood work and abdominal scans in February of 2022. She had increased liver enzymes due to Crestor. They normalized with cessation of the medication.    The following portions of the chart were reviewed this encounter and updated as appropriate:  Tobacco  Allergies  Meds  Problems  Med Hx  Surg Hx  Fam Hx. She has a negative Review of Systems.    Objective  Well appearing patient in no apparent distress; mood and affect are within normal limits.  A focused examination was performed including arms and legs. Relevant physical exam findings are noted in the Assessment and Plan.  Left Lower Leg - Anterior, Right Lower Leg - Anterior Both legs. Vascular macules. Non-blanching.      Assessment & Plan  Vasculitis (HCC) (2) Left Lower Leg - Anterior; Right Lower Leg - Anterior  Advised her to begin antihistamine treatment. She indicated that she cannot tolerate them because and it gives her migraine headaches.  Follow up in 2-4 weeks.    I, Axl Rodino, PA-C, have reviewed all documentation's for this visit.  The documentation on 01/07/21 for the exam, diagnosis, procedures and orders are all accurate and complete.

## 2021-01-27 ENCOUNTER — Ambulatory Visit: Payer: Medicare Other | Admitting: Dermatology

## 2021-01-28 DIAGNOSIS — K219 Gastro-esophageal reflux disease without esophagitis: Secondary | ICD-10-CM | POA: Diagnosis not present

## 2021-01-28 DIAGNOSIS — I10 Essential (primary) hypertension: Secondary | ICD-10-CM | POA: Diagnosis not present

## 2021-01-28 DIAGNOSIS — E785 Hyperlipidemia, unspecified: Secondary | ICD-10-CM | POA: Diagnosis not present

## 2021-01-28 DIAGNOSIS — M810 Age-related osteoporosis without current pathological fracture: Secondary | ICD-10-CM | POA: Diagnosis not present

## 2021-02-05 DIAGNOSIS — E785 Hyperlipidemia, unspecified: Secondary | ICD-10-CM | POA: Diagnosis not present

## 2021-02-05 DIAGNOSIS — I10 Essential (primary) hypertension: Secondary | ICD-10-CM | POA: Diagnosis not present

## 2021-02-05 DIAGNOSIS — Z23 Encounter for immunization: Secondary | ICD-10-CM | POA: Diagnosis not present

## 2021-02-05 DIAGNOSIS — M79661 Pain in right lower leg: Secondary | ICD-10-CM | POA: Diagnosis not present

## 2021-02-05 DIAGNOSIS — I739 Peripheral vascular disease, unspecified: Secondary | ICD-10-CM | POA: Diagnosis not present

## 2021-02-05 DIAGNOSIS — M810 Age-related osteoporosis without current pathological fracture: Secondary | ICD-10-CM | POA: Diagnosis not present

## 2021-02-05 DIAGNOSIS — Z0000 Encounter for general adult medical examination without abnormal findings: Secondary | ICD-10-CM | POA: Diagnosis not present

## 2021-02-05 DIAGNOSIS — R252 Cramp and spasm: Secondary | ICD-10-CM | POA: Diagnosis not present

## 2021-02-05 DIAGNOSIS — M545 Low back pain, unspecified: Secondary | ICD-10-CM | POA: Diagnosis not present

## 2021-02-05 DIAGNOSIS — E559 Vitamin D deficiency, unspecified: Secondary | ICD-10-CM | POA: Diagnosis not present

## 2021-02-11 DIAGNOSIS — H81399 Other peripheral vertigo, unspecified ear: Secondary | ICD-10-CM | POA: Diagnosis not present

## 2021-02-11 DIAGNOSIS — H43393 Other vitreous opacities, bilateral: Secondary | ICD-10-CM | POA: Diagnosis not present

## 2021-02-11 DIAGNOSIS — H1045 Other chronic allergic conjunctivitis: Secondary | ICD-10-CM | POA: Diagnosis not present

## 2021-02-11 DIAGNOSIS — T7840XS Allergy, unspecified, sequela: Secondary | ICD-10-CM | POA: Diagnosis not present

## 2021-02-11 DIAGNOSIS — J019 Acute sinusitis, unspecified: Secondary | ICD-10-CM | POA: Diagnosis not present

## 2021-02-14 DIAGNOSIS — I10 Essential (primary) hypertension: Secondary | ICD-10-CM | POA: Diagnosis not present

## 2021-02-14 DIAGNOSIS — E785 Hyperlipidemia, unspecified: Secondary | ICD-10-CM | POA: Diagnosis not present

## 2021-02-14 DIAGNOSIS — K219 Gastro-esophageal reflux disease without esophagitis: Secondary | ICD-10-CM | POA: Diagnosis not present

## 2021-02-14 DIAGNOSIS — M810 Age-related osteoporosis without current pathological fracture: Secondary | ICD-10-CM | POA: Diagnosis not present

## 2021-02-19 DIAGNOSIS — Z23 Encounter for immunization: Secondary | ICD-10-CM | POA: Diagnosis not present

## 2021-04-01 DIAGNOSIS — M810 Age-related osteoporosis without current pathological fracture: Secondary | ICD-10-CM | POA: Diagnosis not present

## 2021-04-01 DIAGNOSIS — K219 Gastro-esophageal reflux disease without esophagitis: Secondary | ICD-10-CM | POA: Diagnosis not present

## 2021-04-01 DIAGNOSIS — E785 Hyperlipidemia, unspecified: Secondary | ICD-10-CM | POA: Diagnosis not present

## 2021-04-01 DIAGNOSIS — I10 Essential (primary) hypertension: Secondary | ICD-10-CM | POA: Diagnosis not present

## 2021-04-04 ENCOUNTER — Other Ambulatory Visit (HOSPITAL_COMMUNITY): Payer: Self-pay | Admitting: Cardiovascular Disease

## 2021-04-04 DIAGNOSIS — I739 Peripheral vascular disease, unspecified: Secondary | ICD-10-CM

## 2021-04-07 ENCOUNTER — Other Ambulatory Visit: Payer: Self-pay | Admitting: Cardiovascular Disease

## 2021-04-09 ENCOUNTER — Encounter: Payer: Self-pay | Admitting: Podiatry

## 2021-04-09 ENCOUNTER — Ambulatory Visit (INDEPENDENT_AMBULATORY_CARE_PROVIDER_SITE_OTHER): Payer: Medicare Other

## 2021-04-09 ENCOUNTER — Other Ambulatory Visit: Payer: Self-pay

## 2021-04-09 ENCOUNTER — Ambulatory Visit: Payer: Medicare Other | Admitting: Podiatry

## 2021-04-09 DIAGNOSIS — L600 Ingrowing nail: Secondary | ICD-10-CM | POA: Diagnosis not present

## 2021-04-09 DIAGNOSIS — M2021 Hallux rigidus, right foot: Secondary | ICD-10-CM

## 2021-04-09 DIAGNOSIS — M778 Other enthesopathies, not elsewhere classified: Secondary | ICD-10-CM

## 2021-04-09 MED ORDER — TRIAMCINOLONE ACETONIDE 10 MG/ML IJ SUSP
10.0000 mg | Freq: Once | INTRAMUSCULAR | Status: AC
  Administered 2021-04-09: 10 mg

## 2021-04-09 NOTE — Patient Instructions (Signed)

## 2021-04-09 NOTE — Progress Notes (Signed)
Subjective:   Patient ID: Diane Villa, female   DOB: 75 y.o.   MRN: [id number]   HPI Patient presents stating that she has been getting a lot of burning in her big toe and the big toe joint has really been bothering her and she knows that she is ultimately can need surgery on it.  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  For the first MPJ I did sterile prep and injected the joint 3 mg Kenalog 5 mg Xylocaine and discussed implant surgery which will be done in January for her to be back in January for consult.  Reviewed Noralee Stain get a try to fix this and I am hoping this will solve the problem even though some of her pain may be due to the joint surface and I went ahead today explained procedure allowed her to sign consent form and infiltrated the right hallux 60 mg like Marcaine mixture sterile prep done and using sterile instrumentation remove the lateral border exposed matrix applied phenol 3 applications 30 seconds followed by alcohol lavage sterile dressing gave instructions on soaks and to leave dressing on for 24 hours but take it off earlier if any throbbing were to occur

## 2021-04-17 ENCOUNTER — Encounter (HOSPITAL_COMMUNITY): Payer: Medicare Other

## 2021-04-18 ENCOUNTER — Other Ambulatory Visit: Payer: Self-pay

## 2021-04-18 ENCOUNTER — Ambulatory Visit (HOSPITAL_COMMUNITY)
Admission: RE | Admit: 2021-04-18 | Discharge: 2021-04-18 | Disposition: A | Discharge status: Home / Self Care | Payer: Medicare Other | Source: Ambulatory Visit | Attending: Cardiology | Admitting: Cardiology

## 2021-04-18 DIAGNOSIS — I739 Peripheral vascular disease, unspecified: Secondary | ICD-10-CM | POA: Diagnosis not present

## 2021-05-06 DIAGNOSIS — M25561 Pain in right knee: Secondary | ICD-10-CM | POA: Diagnosis not present

## 2021-05-07 ENCOUNTER — Other Ambulatory Visit: Payer: Self-pay | Admitting: Sports Medicine

## 2021-05-07 ENCOUNTER — Ambulatory Visit
Admission: RE | Admit: 2021-05-07 | Discharge: 2021-05-07 | Disposition: A | Discharge status: Home / Self Care | Payer: Medicare Other | Source: Ambulatory Visit | Attending: Sports Medicine | Admitting: Sports Medicine

## 2021-05-07 DIAGNOSIS — M25561 Pain in right knee: Secondary | ICD-10-CM

## 2021-05-11 ENCOUNTER — Other Ambulatory Visit: Payer: Self-pay | Admitting: Cardiovascular Disease

## 2021-05-14 ENCOUNTER — Encounter: Payer: Self-pay | Admitting: Cardiovascular Disease

## 2021-05-14 ENCOUNTER — Other Ambulatory Visit: Payer: Self-pay

## 2021-05-14 ENCOUNTER — Ambulatory Visit: Payer: Medicare Other | Admitting: Cardiovascular Disease

## 2021-05-14 DIAGNOSIS — I10 Essential (primary) hypertension: Secondary | ICD-10-CM | POA: Diagnosis not present

## 2021-05-14 DIAGNOSIS — E782 Mixed hyperlipidemia: Secondary | ICD-10-CM | POA: Diagnosis not present

## 2021-05-14 DIAGNOSIS — I739 Peripheral vascular disease, unspecified: Secondary | ICD-10-CM | POA: Diagnosis not present

## 2021-05-14 NOTE — Progress Notes (Signed)
05/14/2021 Diane Villa   1946-05-28  [id number]  Primary Physician Harlan Stains, MD Primary Cardiologist: Lorretta Harp MD [name], [place]  HPI:  Diane Villa is a 75 y.o.  thin and fit appearing widowed Caucasian female mother of 2 children, grandmother of 1 grandchild who was referred to me by Dr. Paulla Dolly, her podiatrist for right calf lifestyle limiting claudication.  I last saw her in the office 10/17/2019.Marland Kitchen She is a retired Optometrist.  Her only cardiovascular risk factors are treated hypertension and untreated hyperlipidemia.  There is no family history.  She is never had a heart attack or stroke.  She denies chest pain or shortness of breath.  She exercises on a daily basis 70 to 100 minutes a day including playing tennis.  She is noted right calf claudication over the last several months which is lifestyle limiting.  Dopplers performed 09/05/2019 revealed a right ankle-brachial index of 0.55 and a left of 1.03 with an occluded right SFA.     I performed peripheral angiography on her 09/25/2019 revealing subtotally occluded short segment pre-CTO mid right SFA with one-vessel runoff bilaterally via the peroneal artery.  I performed [facility] 1 directional atherectomy followed by drug-coated balloon angioplasty with excellent angiographic result.  Her claudication has resolved.  Her Dopplers have normalized.  She did have some ecchymosis in her left groin and suprapubic pubic area which tracked down the medial aspect of her left medial thigh and has since disappeared.    Since I saw her a year and a half she has done well.  She apparently has some right knee osteoarthritis with pain in her knee and foot although she denies claudication.  She has lost 20 pounds as result of diet and exercise since I last saw her.  Her lipid profile is excellent with an LDL in the 50 range.  I did get a coronary calcium score score and her blood 10/27/2019 which was 5.  She had recent  Doppler studies that showed a high-frequency signal in her distal right SFA on 04/18/2021 although she denies claudication.  Her right ABI dropped from 1.04 down to 0.88.  Current Meds  Medication Sig   ALPRAZolam (XANAX) 0.5 MG tablet Take 0.25 mg by mouth 3 (three) times daily as needed for anxiety.    amLODipine (NORVASC) 2.5 MG tablet Take 2.5 mg by mouth at bedtime.    ASPIRIN LOW DOSE 81 MG EC tablet TAKE 1 TABLET BY MOUTH EVERY DAY   atenolol (TENORMIN) 100 MG tablet Take 100 mg by mouth at bedtime.    cholecalciferol (VITAMIN D) 1000 UNITS tablet Take 1,000 Units by mouth daily.   clobetasol ointment (TEMOVATE) 0.05 % SMARTSIG:Sparingly Topical Daily PRN   Coenzyme Q10 (CO Q-10) 200 MG CAPS See admin instructions.   losartan (COZAAR) 100 MG tablet Take 100 mg by mouth at bedtime.    omeprazole (PRILOSEC) 40 MG capsule Take 1 capsule by mouth as needed.   Propylene Glycol (SYSTANE COMPLETE) 0.6 % SOLN Place 1 drop into both eyes 3 (three) times daily as needed (dry/irritated eyes.).   rosuvastatin (CRESTOR) 10 MG tablet Take 10 mg by mouth daily.     Allergies  Allergen Reactions   Ambien [Zolpidem Tartrate]    Elemental Sulfur     RASH ON ABDOMEN   Sulfamethoxazole Rash    Social History   Socioeconomic History   Marital status: Widowed    Spouse name: Not on file  Number of children: 2   Years of education: Not on file   Highest education level: Not on file  Occupational History   Occupation: retired    Comment: accounts payable  Tobacco Use   Smoking status: Never   Smokeless tobacco: Never  Vaping Use   Vaping Use: Never used  Substance and Sexual Activity   Alcohol use: No   Drug use: No   Sexual activity: Not on file  Other Topics Concern   Not on file  Social History Narrative   Not on file   Social Determinants of Health   Financial Resource Strain: Not on file  Food Insecurity: Not on file  Transportation Needs: Not on file  Physical Activity:  Not on file  Stress: Not on file  Social Connections: Not on file  Intimate Partner Violence: Not on file     Review of Systems: General: negative for chills, fever, night sweats or weight changes.  Cardiovascular: negative for chest pain, dyspnea on exertion, edema, orthopnea, palpitations, paroxysmal nocturnal dyspnea or shortness of breath Dermatological: negative for rash Respiratory: negative for cough or wheezing Urologic: negative for hematuria Abdominal: negative for nausea, vomiting, diarrhea, bright red blood per rectum, melena, or hematemesis Neurologic: negative for visual changes, syncope, or dizziness All other systems reviewed and are otherwise negative except as noted above.    Blood pressure (!) 186/79, pulse 63, height 5' 1.5" (1.562 m), weight 107 lb 9.6 oz (48.8 kg), SpO2 97 %.  General appearance: alert and no distress Neck: no adenopathy, no carotid bruit, no JVD, supple, symmetrical, trachea midline, and thyroid not enlarged, symmetric, no tenderness/mass/nodules Lungs: clear to auscultation bilaterally Heart: regular rate and rhythm, S1, S2 normal, no murmur, click, rub or gallop Extremities: extremities normal, atraumatic, no cyanosis or edema Pulses: 2+ and symmetric Skin: Skin color, texture, turgor normal. No rashes or lesions Neurologic: Grossly normal  EKG sinus rhythm at 63 without ST or T wave changes.  I personally reviewed this EKG.  ASSESSMENT AND PLAN:   Peripheral arterial disease (HCC) History of PAD status post [facility] 1 directional atherectomy followed by drug-coated balloon angioplasty of a subtotally occluded mid right SFA by myself 09/25/2019.  She had one-vessel runoff bilaterally via peroneal arteries.  Her symptoms claudication resolved after that.  She did have screening Dopplers done 04/18/2021 revealed a high-frequency signal in her distal right SFA with a decline in her right ABI from 1.04 down to 0.88 although she is completely  asymptomatic.  We will continue to follow her by duplex ultrasound.  At this point, I do not see an indication for repeat revascularization.  Essential hypertension History of essential hypertension with blood pressure measured today at 186/79.  She is on amlodipine, atenolol and losartan.  Hyperlipidemia History of hyperlipidemia on rosuvastatin with lipid profile performed 02/05/2021 revealing total cholesterol 129, LDL 51 and HDL of 65.     Runell Gess MD FACP,FACC,FAHA, [facility] 05/14/2021 10:12 AM

## 2021-05-14 NOTE — Assessment & Plan Note (Signed)
History of hyperlipidemia on rosuvastatin with lipid profile performed 02/05/2021 revealing total cholesterol 129, LDL 51 and HDL of 65.

## 2021-05-14 NOTE — Assessment & Plan Note (Signed)
History of essential hypertension with blood pressure measured today at 186/79.  She is on amlodipine, atenolol and losartan.

## 2021-05-14 NOTE — Patient Instructions (Signed)
Medication Instructions:  °Your physician recommends that you continue on your current medications as directed. Please refer to the Current Medication list given to you today. ° °*If you need a refill on your cardiac medications before your next appointment, please call your pharmacy* ° ° °Testing/Procedures: °Your physician has requested that you have a lower extremity arterial duplex. This test is an ultrasound of the arteries in the legs. It looks at arterial blood flow in the legs. Allow one hour for Lower Arterial scans. There are no restrictions or special instructions ° °Your physician has requested that you have an ankle brachial index (ABI). During this test an ultrasound and blood pressure cuff are used to evaluate the arteries that supply the arms and legs with blood. Allow thirty minutes for this exam. There are no restrictions or special instructions. ° °To be done in June 2023. These procedures are done at [address] ° °Follow-Up: °At [facility], you and your health needs are our priority.  As part of our continuing mission to provide you with exceptional heart care, we have created designated Provider Care Teams.  These Care Teams include your primary Cardiologist (physician) and Advanced Practice Providers (APPs -  Physician Assistants and Nurse Practitioners) who all work together to provide you with the care you need, when you need it. ° °We recommend signing up for the patient portal called "MyChart".  Sign up information is provided on this After Visit Summary.  MyChart is used to connect with patients for Virtual Visits (Telemedicine).  Patients are able to view lab/test results, encounter notes, upcoming appointments, etc.  Non-urgent messages can be sent to your provider as well.   °To learn more about what you can do with MyChart, go to [email or web address].   ° °Your next appointment:   °6 month(s) ° °The format for your next appointment:   °In Person ° °Provider:   °Jonathan  Berry, MD ° °

## 2021-05-14 NOTE — Assessment & Plan Note (Signed)
History of PAD status post [facility] 1 directional atherectomy followed by drug-coated balloon angioplasty of a subtotally occluded mid right SFA by myself 09/25/2019.  She had one-vessel runoff bilaterally via peroneal arteries.  Her symptoms claudication resolved after that.  She did have screening Dopplers done 04/18/2021 revealed a high-frequency signal in her distal right SFA with a decline in her right ABI from 1.04 down to 0.88 although she is completely asymptomatic.  We will continue to follow her by duplex ultrasound.  At this point, I do not see an indication for repeat revascularization.

## 2021-05-26 ENCOUNTER — Telehealth: Payer: Self-pay | Admitting: Urology

## 2021-05-26 NOTE — Telephone Encounter (Signed)
DOS - 06/17/21  KELLER BUNION IMPLANT RIGHT --- [id number]   [facility] EFFECTIVE DATE - 06/08/21  PER [employer] WEBSITE FOR CPT CODE 99833 Notification or Prior Authorization is not required for the requested services  Decision ID #:[id number]

## 2021-06-03 ENCOUNTER — Telehealth: Payer: Self-pay

## 2021-06-03 NOTE — Telephone Encounter (Signed)
Diane Villa called to cancel her surgery with Dr. Charlsie Merles on 06/17/2021. She stated she saw her Vascular dr,Dr. Allyson Sabal, and he wants to to wait to have /surgery. She is scheduled to see Dr. Allyson Sabal on 11/28/2021. She will let us know once she is cleared. Notified Dr. Charlsie Merles and Luster Landsberg with [facility]

## 2021-06-04 NOTE — Telephone Encounter (Signed)
Ok, she can come in and see me if she has questions

## 2021-06-10 ENCOUNTER — Other Ambulatory Visit: Payer: Self-pay | Admitting: Cardiovascular Disease

## 2021-06-11 ENCOUNTER — Ambulatory Visit: Payer: Medicare Other | Admitting: Podiatry

## 2021-06-11 DIAGNOSIS — M1711 Unilateral primary osteoarthritis, right knee: Secondary | ICD-10-CM | POA: Diagnosis not present

## 2021-06-11 DIAGNOSIS — E785 Hyperlipidemia, unspecified: Secondary | ICD-10-CM | POA: Diagnosis not present

## 2021-06-11 DIAGNOSIS — I10 Essential (primary) hypertension: Secondary | ICD-10-CM | POA: Diagnosis not present

## 2021-06-11 DIAGNOSIS — M810 Age-related osteoporosis without current pathological fracture: Secondary | ICD-10-CM | POA: Diagnosis not present

## 2021-06-23 ENCOUNTER — Encounter: Payer: Medicare Other | Admitting: Podiatry

## 2021-06-25 ENCOUNTER — Telehealth: Payer: Medicare Other | Admitting: Physician Assistant

## 2021-06-25 DIAGNOSIS — R3989 Other symptoms and signs involving the genitourinary system: Secondary | ICD-10-CM

## 2021-06-25 MED ORDER — CEPHALEXIN 500 MG PO CAPS: 500.0000 mg | ORAL_CAPSULE | Freq: Two times a day (BID) | ORAL | 0 refills | Status: AC

## 2021-06-25 NOTE — Progress Notes (Signed)
Virtual Visit Consent   Diane Villa, you are scheduled for a virtual visit with a [facility] provider today.     Just as with appointments in the office, your consent must be obtained to participate.  Your consent will be active for this visit and any virtual visit you may have with one of our providers in the next 365 days.     If you have a MyChart account, a copy of this consent can be sent to you electronically.  All virtual visits are billed to your insurance company just like a traditional visit in the office.    As this is a virtual visit, video technology does not allow for your provider to perform a traditional examination.  This may limit your provider's ability to fully assess your condition.  If your provider identifies any concerns that need to be evaluated in person or the need to arrange testing (such as labs, EKG, etc.), we will make arrangements to do so.     Although advances in technology are sophisticated, we cannot ensure that it will always work on either your end or our end.  If the connection with a video visit is poor, the visit may have to be switched to a telephone visit.  With either a video or telephone visit, we are not always able to ensure that we have a secure connection.     I need to obtain your verbal consent now.   Are you willing to proceed with your visit today?    Diane Villa has provided verbal consent on 06/25/2021 for a virtual visit (video or telephone).   Leeanne Rio, [place]   Date: 06/25/2021 5:18 PM   Virtual Visit via Video Note   I, Leeanne Rio, connected with  Diane Villa  ([id number], 25-Jul-1945) on 06/25/21 at  5:30 PM EST by a video-enabled telemedicine application and verified that I am speaking with the correct person using two identifiers.  Location: Patient: Virtual Visit Location Patient: Home Provider: Virtual Visit Location Provider: Home Office   I discussed the limitations of evaluation and management by  telemedicine and the availability of in person appointments. The patient expressed understanding and agreed to proceed.    History of Present Illness: Diane Villa is a 75 y.o. who identifies as a female who was assigned female at birth, and is being seen today for possible UTI. Notes today starting with urinary urgency, frequency, small blood in the urine. Denies fever, chills, aches. Denies nausea or vomiting. Denies flank pain. Denies vaginal symptoms.  HPI: HPI  Problems:  Patient Active Problem List   Diagnosis Date Noted   Age-related osteoporosis without current pathological fracture 11/13/2020   Anxiety 11/13/2020   Chronic insomnia 11/13/2020   Gastroesophageal reflux disease 11/13/2020   Irritable bowel syndrome 11/13/2020   Microscopic hematuria 11/13/2020   Sacroiliitis, not elsewhere classified ([place]) 11/13/2020   Vitamin D deficiency 11/13/2020   Claudication in peripheral vascular disease ([place]) 09/25/2019   Peripheral arterial disease ([place]) 09/12/2019   Essential hypertension 09/12/2019   Hyperlipidemia 09/12/2019    Allergies:  Allergies  Allergen Reactions   Ambien [Zolpidem Tartrate]    Elemental Sulfur     RASH ON ABDOMEN   Sulfamethoxazole Rash   Medications:  Current Outpatient Medications:    ALPRAZolam (XANAX) 0.5 MG tablet, Take 0.25 mg by mouth 3 (three) times daily as needed for anxiety. , Disp: , Rfl: 0   amLODipine (NORVASC) 2.5 MG tablet, Take  2.5 mg by mouth at bedtime. , Disp: , Rfl:    ASPIRIN LOW DOSE 81 MG EC tablet, TAKE 1 TABLET BY MOUTH EVERY DAY, Disp: 30 tablet, Rfl: 5   atenolol (TENORMIN) 100 MG tablet, Take 100 mg by mouth at bedtime. , Disp: , Rfl:    cholecalciferol (VITAMIN D) 1000 UNITS tablet, Take 1,000 Units by mouth daily., Disp: , Rfl:    clobetasol ointment (TEMOVATE) 0.05 %, SMARTSIG:Sparingly Topical Daily PRN, Disp: , Rfl:    Coenzyme Q10 (CO Q-10) 200 MG CAPS, See admin instructions., Disp: , Rfl:    losartan (COZAAR) 100  MG tablet, Take 100 mg by mouth at bedtime. , Disp: , Rfl:    omeprazole (PRILOSEC) 40 MG capsule, Take 1 capsule by mouth as needed., Disp: , Rfl:    Propylene Glycol (SYSTANE COMPLETE) 0.6 % SOLN, Place 1 drop into both eyes 3 (three) times daily as needed (dry/irritated eyes.)., Disp: , Rfl:    rosuvastatin (CRESTOR) 10 MG tablet, Take 10 mg by mouth daily., Disp: , Rfl:   Observations/Objective: Patient is well-developed, well-nourished in no acute distress.  Resting comfortably at home.  Head is normocephalic, atraumatic.  No labored breathing. Speech is clear and coherent with logical content.  Patient is alert and oriented at baseline.   Assessment and Plan: 1. Suspected UTI  Classic UTI symptoms without any alarm signs/symptoms. Will start empiric treatment with Keflex 500 mg BID x 7 days. Supportive measures reviewed. Strict in person follow-up precautions discussed.   Follow Up Instructions: I discussed the assessment and treatment plan with the patient. The patient was provided an opportunity to ask questions and all were answered. The patient agreed with the plan and demonstrated an understanding of the instructions.  A copy of instructions were sent to the patient via MyChart unless otherwise noted below.   The patient was advised to call back or seek an in-person evaluation if the symptoms worsen or if the condition fails to improve as anticipated.  Time:  I spent 10 minutes with the patient via telehealth technology discussing the above problems/concerns.    Leeanne Rio, PA-C

## 2021-06-25 NOTE — Patient Instructions (Addendum)
Diane Villa, thank you for joining [name], PA-C for today's virtual visit.  While this provider is not your primary care provider (PCP), if your PCP is located in our provider database this encounter information will be shared with them immediately following your visit.  Consent: (Patient) Diane Villa provided verbal consent for this virtual visit at the beginning of the encounter.  Current Medications:  Current Outpatient Medications:    ALPRAZolam (XANAX) 0.5 MG tablet, Take 0.25 mg by mouth 3 (three) times daily as needed for anxiety. , Disp: , Rfl: 0   amLODipine (NORVASC) 2.5 MG tablet, Take 2.5 mg by mouth at bedtime. , Disp: , Rfl:    ASPIRIN LOW DOSE 81 MG EC tablet, TAKE 1 TABLET BY MOUTH EVERY DAY, Disp: 30 tablet, Rfl: 5   atenolol (TENORMIN) 100 MG tablet, Take 100 mg by mouth at bedtime. , Disp: , Rfl:    cholecalciferol (VITAMIN D) 1000 UNITS tablet, Take 1,000 Units by mouth daily., Disp: , Rfl:    clobetasol ointment (TEMOVATE) 0.05 %, SMARTSIG:Sparingly Topical Daily PRN, Disp: , Rfl:    Coenzyme Q10 (CO Q-10) 200 MG CAPS, See admin instructions., Disp: , Rfl:    losartan (COZAAR) 100 MG tablet, Take 100 mg by mouth at bedtime. , Disp: , Rfl:    omeprazole (PRILOSEC) 40 MG capsule, Take 1 capsule by mouth as needed., Disp: , Rfl:    Propylene Glycol (SYSTANE COMPLETE) 0.6 % SOLN, Place 1 drop into both eyes 3 (three) times daily as needed (dry/irritated eyes.)., Disp: , Rfl:    rosuvastatin (CRESTOR) 10 MG tablet, Take 10 mg by mouth daily., Disp: , Rfl:    Medications ordered in this encounter:  No orders of the defined types were placed in this encounter.    *If you need refills on other medications prior to your next appointment, please contact your pharmacy*  Follow-Up: Call back or seek an in-person evaluation if the symptoms worsen or if the condition fails to improve as anticipated.  Other Instructions Your symptoms are consistent with a  bladder infection, also called acute cystitis. Please take your antibiotic (Keflex) as directed until all pills are gone.  Stay very well hydrated.  Consider a daily probiotic (Align, Culturelle, or Activia) to help prevent stomach upset caused by the antibiotic.  Taking a probiotic daily may also help prevent recurrent UTIs.  Also consider taking AZO (Phenazopyridine) tablets to help decrease pain with urination.     Urinary Tract Infection A urinary tract infection (UTI) can occur any place along the urinary tract. The tract includes the kidneys, ureters, bladder, and urethra. A type of germ called bacteria often causes a UTI. UTIs are often helped with antibiotic medicine.  HOME CARE  If given, take antibiotics as told by your doctor. Finish them even if you start to feel better. Drink enough fluids to keep your pee (urine) clear or pale yellow. Avoid tea, drinks with caffeine, and bubbly (carbonated) drinks. Pee often. Avoid holding your pee in for a long time. Pee before and after having sex (intercourse). Wipe from front to back after you poop (bowel movement) if you are a woman. Use each tissue only once. GET HELP RIGHT AWAY IF:  You have back pain. You have lower belly (abdominal) pain. You have chills. You feel sick to your stomach (nauseous). You throw up (vomit). Your burning or discomfort with peeing does not go away. You have a fever. Your symptoms are not better in  3 days. MAKE SURE YOU:  Understand these instructions. Will watch your condition. Will get help right away if you are not doing well or get worse. Document Released: 11/11/2007 Document Revised: 02/17/2012 Document Reviewed: 12/24/2011 [facility] Patient Information 2015 [place], [place]. This information is not intended to replace advice given to you by your health care provider. Make sure you discuss any questions you have with your health care provider.   If you have been instructed to have an in-person  evaluation today at a local Urgent Care facility, please use the link below. It will take you to a list of all of our available [facility], including address, phone number and hours of operation. Please do not delay care.  [facility]  If you or a family member do not have a primary care provider, use the link below to schedule a visit and establish care. When you choose a [facility] primary care physician or advanced practice provider, you gain a long-term partner in health. Find a Primary Care Provider  Learn more about [facility]'s in-office and virtual care options: [place] - Get Care Now

## 2021-07-01 DIAGNOSIS — M1711 Unilateral primary osteoarthritis, right knee: Secondary | ICD-10-CM | POA: Diagnosis not present

## 2021-07-01 DIAGNOSIS — M19071 Primary osteoarthritis, right ankle and foot: Secondary | ICD-10-CM | POA: Diagnosis not present

## 2021-08-06 DIAGNOSIS — E785 Hyperlipidemia, unspecified: Secondary | ICD-10-CM | POA: Diagnosis not present

## 2021-08-06 DIAGNOSIS — I10 Essential (primary) hypertension: Secondary | ICD-10-CM | POA: Diagnosis not present

## 2021-08-06 DIAGNOSIS — I739 Peripheral vascular disease, unspecified: Secondary | ICD-10-CM | POA: Diagnosis not present

## 2021-09-24 DIAGNOSIS — R002 Palpitations: Secondary | ICD-10-CM | POA: Diagnosis not present

## 2021-09-24 DIAGNOSIS — R001 Bradycardia, unspecified: Secondary | ICD-10-CM | POA: Diagnosis not present

## 2021-10-06 ENCOUNTER — Ambulatory Visit: Payer: Medicare Other | Admitting: Podiatry

## 2021-10-06 DIAGNOSIS — I999 Unspecified disorder of circulatory system: Secondary | ICD-10-CM | POA: Diagnosis not present

## 2021-10-06 DIAGNOSIS — M778 Other enthesopathies, not elsewhere classified: Secondary | ICD-10-CM | POA: Diagnosis not present

## 2021-10-06 MED ORDER — TRIAMCINOLONE ACETONIDE 10 MG/ML IJ SUSP
10.0000 mg | Freq: Once | INTRAMUSCULAR | Status: AC
  Administered 2021-10-06: 10 mg

## 2021-10-07 NOTE — Progress Notes (Signed)
Subjective:  ? ?Patient ID: Rush Barer, female   DOB: 76 y.o.   MRN: [id number]  ? ?HPI ?Patient states her big toe joint right gives her more problems and she really wants to get it fixed but her cardiologist was concerned about circulation even though her ABI is 0.88 and did not want her getting it done until he sees her again ? ? ?ROS ? ? ?   ?Objective:  ?Physical Exam  ?Neurovascular status intact with patient having palpable pulses and warm feet does have quite a bit of swelling and pain around the first MPJ right which is impeding her activity levels and her tennis that she loves to play ? ?   ?Assessment:  ?Significant hallux limitus condition right with arthritis with inflammatory capsulitis with mild vascular disease ? ?   ?Plan:  ?H&P reviewed the vascular disease at great length and I think overall she is very low risk for problems given her ABI 8.88 which I educated her on.  She will see Dr. Alvester Chou and discuss again but I do think that she would do well with surgery and I do not see her as a risk from that perspective but I will get his advice at their next visit before final decision.  I did do sterile prep and I injected the first MPJ 3 mg Dexasone Kenalog 5 mg Xylocaine periarticular ?   ? ? ?

## 2021-10-29 DIAGNOSIS — I10 Essential (primary) hypertension: Secondary | ICD-10-CM | POA: Diagnosis not present

## 2021-10-29 DIAGNOSIS — R002 Palpitations: Secondary | ICD-10-CM | POA: Diagnosis not present

## 2021-11-04 ENCOUNTER — Other Ambulatory Visit: Payer: Self-pay | Admitting: Family Medicine

## 2021-11-04 DIAGNOSIS — Z1231 Encounter for screening mammogram for malignant neoplasm of breast: Secondary | ICD-10-CM

## 2021-11-07 ENCOUNTER — Ambulatory Visit (HOSPITAL_COMMUNITY)
Admission: RE | Admit: 2021-11-07 | Discharge: 2021-11-07 | Disposition: A | Discharge status: Home / Self Care | Payer: Medicare Other | Source: Ambulatory Visit | Attending: Cardiovascular Disease | Admitting: Cardiovascular Disease

## 2021-11-07 DIAGNOSIS — I739 Peripheral vascular disease, unspecified: Secondary | ICD-10-CM | POA: Insufficient documentation

## 2021-11-12 ENCOUNTER — Ambulatory Visit (INDEPENDENT_AMBULATORY_CARE_PROVIDER_SITE_OTHER): Payer: Medicare Other

## 2021-11-12 ENCOUNTER — Encounter: Payer: Self-pay | Admitting: Cardiovascular Disease

## 2021-11-12 ENCOUNTER — Ambulatory Visit: Payer: Medicare Other | Admitting: Cardiovascular Disease

## 2021-11-12 DIAGNOSIS — I739 Peripheral vascular disease, unspecified: Secondary | ICD-10-CM

## 2021-11-12 DIAGNOSIS — I10 Essential (primary) hypertension: Secondary | ICD-10-CM | POA: Diagnosis not present

## 2021-11-12 DIAGNOSIS — Z01818 Encounter for other preprocedural examination: Secondary | ICD-10-CM | POA: Diagnosis not present

## 2021-11-12 DIAGNOSIS — R002 Palpitations: Secondary | ICD-10-CM | POA: Diagnosis not present

## 2021-11-12 DIAGNOSIS — E782 Mixed hyperlipidemia: Secondary | ICD-10-CM

## 2021-11-12 NOTE — Progress Notes (Unsigned)
Enrolled for [employer] to mail a ZIO XT long term holter monitor to the patients address on file.  

## 2021-11-12 NOTE — Assessment & Plan Note (Signed)
Long history of palpitations.  She does not drink caffeine.  She was better controlled on atenolol but this was unfortunately changed to metoprolol because of asymptomatic bradycardia.  I am going to get a 2-week Zio patch to further evaluate.

## 2021-11-12 NOTE — Assessment & Plan Note (Signed)
Patient needs a podiatry procedure by Dr. Charlsie Merles on her right great toe.  I do not think that her peripheral vascular situation would preclude her from undergoing this procedure.

## 2021-11-12 NOTE — Assessment & Plan Note (Signed)
History of essential hypertension a blood pressure measured today at 160/68.  Blood pressures at home however run in the 130/70 range.  She is on a beta-blocker as well as high-dose Diovan and low-dose amlodipine.

## 2021-11-12 NOTE — Assessment & Plan Note (Signed)
History of peripheral arterial disease status post [facility] 1 directional atherectomy followed by drug-coated balloon-angioplasty for subtotally occluded right SFA  09/24/19.  She did have one-vessel runoff via the peroneal.  Her Dopplers normalized and her claudication resolved.  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  Her most recent Doppler studies show significant restenosis with a high-frequency signal in the distal right SFA and a right ABI of 0.98.  She currently denies claudication.  There is no indication for intra reintervention at this time.  We will continue to monitor noninvasively.

## 2021-11-12 NOTE — Assessment & Plan Note (Signed)
History of hyperlipidemia on low-dose statin therapy with lipid profile performed 02/05/2021 revealing total cholesterol 129, LDL 51 and HDL 65.

## 2021-11-12 NOTE — Patient Instructions (Addendum)
Medication Instructions:   -Stop metoprolol succinate (toprol-xl)  -Restart atenolol.  -Stop losartan (cozaar).  *If you need a refill on your cardiac medications before your next appointment, please call your pharmacy*    Testing/Procedures: ZIO XT- Long Term Monitor Instructions  Your physician has requested you wear a ZIO patch monitor for 14 days.  This is a single patch monitor. [employer] supplies one patch monitor per enrollment. Additional stickers are not available. Please do not apply patch if you will be having a Nuclear Stress Test,  Echocardiogram, Cardiac CT, MRI, or Chest Xray during the period you would be wearing the  monitor. The patch cannot be worn during these tests. You cannot remove and re-apply the  ZIO XT patch monitor.  Your ZIO patch monitor will be mailed 3 day USPS to your address on file. It may take 3-5 days  to receive your monitor after you have been enrolled.  Once you have received your monitor, please review the enclosed instructions. Your monitor  has already been registered assigning a specific monitor serial # to you.  Billing and Patient Assistance Program Information  We have supplied [employer] with any of your insurance information on file for billing purposes. [employer] offers a sliding scale Patient Assistance Program for patients that do not have  insurance, or whose insurance does not completely cover the cost of the ZIO monitor.  You must apply for the Patient Assistance Program to qualify for this discounted rate.  To apply, please call [employer] at [phone], select option 4, select option 2, ask to apply for  Patient Assistance Program. Meredeth Ide will ask your household income, and how many people  are in your household. They will quote your out-of-pocket cost based on that information.  [employer] will also be able to set up a 43-month, interest-free payment plan if needed.  Applying the monitor   Shave hair from upper left chest.  Hold  abrader disc by orange tab. Rub abrader in 40 strokes over the upper left chest as  indicated in your monitor instructions.  Clean area with 4 enclosed alcohol pads. Let dry.  Apply patch as indicated in monitor instructions. Patch will be placed under collarbone on left  side of chest with arrow pointing upward.  Rub patch adhesive wings for 2 minutes. Remove white label marked "1". Remove the white  label marked "2". Rub patch adhesive wings for 2 additional minutes.  While looking in a mirror, press and release button in center of patch. A small green light will  flash 3-4 times. This will be your only indicator that the monitor has been turned on.  Do not shower for the first 24 hours. You may shower after the first 24 hours.  Press the button if you feel a symptom. You will hear a small click. Record Date, Time and  Symptom in the Patient Logbook.  When you are ready to remove the patch, follow instructions on the last 2 pages of Patient  Logbook. Stick patch monitor onto the last page of Patient Logbook.  Place Patient Logbook in the blue and white box. Use locking tab on box and tape box closed  securely. The blue and white box has prepaid postage on it. Please place it in the mailbox as  soon as possible. Your physician should have your test results approximately 7 days after the  monitor has been mailed back to [facility].  Call [facility] Customer Care at [phone] if you have questions regarding  your ZIO  XT patch monitor. Call them immediately if you see an orange light blinking on your  monitor.  If your monitor falls off in less than 4 days, contact our Monitor department at [phone].  If your monitor becomes loose or falls off after 4 days call [employer] at [phone] for  suggestions on securing your monitor   Your physician has requested that you have a lower extremity arterial duplex. This test is an ultrasound of the arteries in the legs. It looks at  arterial blood flow in the legs. Allow one hour for Lower Arterial scans. There are no restrictions or special instructions. To be done in June 2024. This procedure will be done at [address] [facility]  Your physician has requested that you have an ankle brachial index (ABI). During this test an ultrasound and blood pressure cuff are used to evaluate the arteries that supply the arms and legs with blood. Allow thirty minutes for this exam. There are no restrictions or special instructions. To be done in June 2024. This procedure will be done at [address] [facility]   Follow-Up: At [facility], you and your health needs are our priority.  As part of our continuing mission to provide you with exceptional heart care, we have created designated Provider Care Teams.  These Care Teams include your primary Cardiologist (physician) and Advanced Practice Providers (APPs -  Physician Assistants and Nurse Practitioners) who all work together to provide you with the care you need, when you need it.  We recommend signing up for the patient portal called "MyChart".  Sign up information is provided on this After Visit Summary.  MyChart is used to connect with patients for Virtual Visits (Telemedicine).  Patients are able to view lab/test results, encounter notes, upcoming appointments, etc.  Non-urgent messages can be sent to your provider as well.   To learn more about what you can do with MyChart, go to [email or web address].    Your next appointment:   6 month(s)  The format for your next appointment:   In Person  Provider:   Micah Flesher, PA-C, Marjie Skiff, PA-C, Juanda Crumble, PA-C, Joni Reining, DNP, ANP, Azalee Course, PA-C, or Bernadene Person, NP       Then, Nanetta Batty, MD will plan to see you again in 12 month(s).    Other Instructions You are cleared at low risk for podiatry surgery.

## 2021-11-12 NOTE — Progress Notes (Signed)
11/12/2021 Diane Villa   February 15, 1946  [id number]  Primary Physician Laurann [place], MD Primary Cardiologist: Runell Gess MD Nicholes [place], [place]  HPI:  Diane Villa is a 76 y.o.  thin and fit appearing widowed Caucasian female mother of 2 children, grandmother of 1 grandchild who was referred to me by Dr. Charlsie Merles, her podiatrist for right calf lifestyle limiting claudication.  I last saw her in the office 05/14/2021.Marland Kitchen She is a retired Forensic scientist.  Her only cardiovascular risk factors are treated hypertension and untreated hyperlipidemia.  There is no family history.  She is never had a heart attack or stroke.  She denies chest pain or shortness of breath.  She exercises on a daily basis 70 to 100 minutes a day including playing tennis.  She is noted right calf claudication over the last several months which is lifestyle limiting.  Dopplers performed 09/05/2019 revealed a right ankle-brachial index of 0.55 and a left of 1.03 with an occluded right SFA.     I performed peripheral angiography on her 09/25/2019 revealing subtotally occluded short segment pre-CTO mid right SFA with one-vessel runoff bilaterally via the peroneal artery.  I performed [facility] 1 directional atherectomy followed by drug-coated balloon angioplasty with excellent angiographic result.  Her claudication has resolved.  Her Dopplers have normalized.  She did have some ecchymosis in her left groin and suprapubic pubic area which tracked down the medial aspect of her left medial thigh and has since disappeared.     She has some right knee osteoarthritis with pain in her knee and foot although she denies claudication.  She has lost 20 pounds as result of diet and exercise since I last saw her.  Her lipid profile is excellent with an LDL in the 50 range.  I did get a coronary calcium score score and her blood 10/27/2019 which was 5.  She had recent Doppler studies that showed a high-frequency signal in her distal  right SFA on 04/18/2021 although she denies claudication.  Her right ABI dropped from 1.04 down to 0.88.  Since I saw her 6 months ago her Dopplers have remained stable.  Her ABI actually increased from 0.88-0.98 although she does have a high-frequency signal in her distal right SFA.  She denies claudication.  She is very active and plays tennis up to an hour and a half at a time.  She denies chest pain or shortness of breath.  She does have fairly frequent palpitations which have gotten worse with the switch from atenolol to metoprolol.  She does not drink caffeine.  I think it is fine going back to atenolol despite asymptomatic bradycardia.  I think she stable to have a podiatry procedure at low peripheral vascular risk given her current Doppler studies.   Current Meds  Medication Sig   Acetaminophen (TYLENOL ARTHRITIS PAIN PO)    ALPRAZolam (XANAX) 0.5 MG tablet Take 0.25 mg by mouth 3 (three) times daily as needed for anxiety.    amLODipine (NORVASC) 2.5 MG tablet Take 2.5 mg by mouth at bedtime.    ASPIRIN LOW DOSE 81 MG EC tablet TAKE 1 TABLET BY MOUTH EVERY DAY   cholecalciferol (VITAMIN D) 1000 UNITS tablet Take 1,000 Units by mouth daily.   clobetasol ointment (TEMOVATE) 0.05 % SMARTSIG:Sparingly Topical Daily PRN   Coenzyme Q10 (CO Q-10) 200 MG CAPS See admin instructions.   diclofenac Sodium (VOLTAREN) 1 % GEL as needed.   metoprolol succinate (TOPROL-XL) 50  MG 24 hr tablet Take 50 mg by mouth 2 (two) times daily.   omeprazole (PRILOSEC) 40 MG capsule Take 1 capsule by mouth as needed.   Propylene Glycol (SYSTANE COMPLETE) 0.6 % SOLN Place 1 drop into both eyes 3 (three) times daily as needed (dry/irritated eyes.).   rosuvastatin (CRESTOR) 10 MG tablet Take 10 mg by mouth daily.   valsartan (DIOVAN) 320 MG tablet 1 tablet     Allergies  Allergen Reactions   Ambien [Zolpidem Tartrate]    Atorvastatin     Other reaction(s): elevated liver tests   Denosumab     Other reaction(s):  consipation   Elemental Sulfur     RASH ON ABDOMEN   Zolpidem     Other reaction(s): too strong   Sulfamethoxazole Rash    Social History   Socioeconomic History   Marital status: Widowed    Spouse name: Not on file   Number of children: 2   Years of education: Not on file   Highest education level: Not on file  Occupational History   Occupation: retired    Comment: accounts payable  Tobacco Use   Smoking status: Never   Smokeless tobacco: Never  Vaping Use   Vaping Use: Never used  Substance and Sexual Activity   Alcohol use: No   Drug use: No   Sexual activity: Not on file  Other Topics Concern   Not on file  Social History Narrative   Not on file   Social Determinants of Health   Financial Resource Strain: Not on file  Food Insecurity: Not on file  Transportation Needs: Not on file  Physical Activity: Not on file  Stress: Not on file  Social Connections: Not on file  Intimate Partner Violence: Not on file     Review of Systems: General: negative for chills, fever, night sweats or weight changes.  Cardiovascular: negative for chest pain, dyspnea on exertion, edema, orthopnea, palpitations, paroxysmal nocturnal dyspnea or shortness of breath Dermatological: negative for rash Respiratory: negative for cough or wheezing Urologic: negative for hematuria Abdominal: negative for nausea, vomiting, diarrhea, bright red blood per rectum, melena, or hematemesis Neurologic: negative for visual changes, syncope, or dizziness All other systems reviewed and are otherwise negative except as noted above.    Blood pressure (!) 160/68, pulse (!) 56, height 5' 1.05" (1.551 m), weight 109 lb 8 oz (49.7 kg), SpO2 98 %.  General appearance: alert and no distress Neck: no adenopathy, no carotid bruit, no JVD, supple, symmetrical, trachea midline, and thyroid not enlarged, symmetric, no tenderness/mass/nodules Lungs: clear to auscultation bilaterally Heart: regular rate and  rhythm, S1, S2 normal, no murmur, click, rub or gallop Extremities: extremities normal, atraumatic, no cyanosis or edema Pulses: 2+ and symmetric Skin: Skin color, texture, turgor normal. No rashes or lesions Neurologic: Grossly normal  EKG sinus bradycardia 56 with nonspecific ST and T wave changes.  Personally reviewed this EKG.  ASSESSMENT AND PLAN:   Peripheral arterial disease (HCC) History of peripheral arterial disease status [facility] 1 directional atherectomy followed by drug-coated balloon-angioplasty for subtotally occluded right SFA  09/24/19.  She did have one-vessel runoff via the peroneal.  Her Dopplers normalized and her claudication resolved.  She is an active Armed forces operational officertennis player and can play singles tennis up to an hour and a half at a time now.  Her most recent Doppler studies show significant restenosis with a high-frequency signal in the distal right SFA and a right ABI of 0.98.  She currently  denies claudication.  There is no indication for intra reintervention at this time.  We will continue to monitor noninvasively.  Essential hypertension History of essential hypertension a blood pressure measured today at 160/68.  Blood pressures at home however run in the 130/70 range.  She is on a beta-blocker as well as high-dose Diovan and low-dose amlodipine.  Hyperlipidemia History of hyperlipidemia on low-dose statin therapy with lipid profile performed 02/05/2021 revealing total cholesterol 129, LDL 51 and HDL 65.  Palpitations Long history of palpitations.  She does not drink caffeine.  She was better controlled on atenolol but this was unfortunately changed to metoprolol because of asymptomatic bradycardia.  I am going to get a 2-week Zio patch to further evaluate.  Preoperative clearance Patient needs a podiatry procedure by Dr. Charlsie Merles on her right great toe.  I do not think that her peripheral vascular situation would preclude her from undergoing this procedure.     Runell Gess MD FACP,FACC,FAHA, [facility] 11/12/2021 10:42 AM

## 2021-11-14 DIAGNOSIS — R002 Palpitations: Secondary | ICD-10-CM | POA: Diagnosis not present

## 2021-12-01 ENCOUNTER — Ambulatory Visit: Payer: Medicare Other | Attending: Internal Medicine

## 2021-12-01 DIAGNOSIS — Z23 Encounter for immunization: Secondary | ICD-10-CM

## 2021-12-02 ENCOUNTER — Other Ambulatory Visit (HOSPITAL_BASED_OUTPATIENT_CLINIC_OR_DEPARTMENT_OTHER): Payer: Self-pay

## 2021-12-02 MED ORDER — PFIZER COVID-19 VAC BIVALENT 30 MCG/0.3ML IM SUSP
INTRAMUSCULAR | 0 refills | Status: AC
  Filled 2021-12-02: qty 0.3, 1d supply, fill #0

## 2021-12-03 ENCOUNTER — Other Ambulatory Visit (HOSPITAL_BASED_OUTPATIENT_CLINIC_OR_DEPARTMENT_OTHER): Payer: Self-pay

## 2021-12-04 DIAGNOSIS — R002 Palpitations: Secondary | ICD-10-CM | POA: Diagnosis not present

## 2021-12-19 IMAGING — DX DG KNEE 3 VIEWS*R*
3 series · 3 of 3 positions shown · non-contrast
Comparison: None.

CLINICAL DATA: Chronic atraumatic right knee pain.

EXAM:
RIGHT KNEE - 3 VIEW

[dg knee 3 views right (1 of 3)]
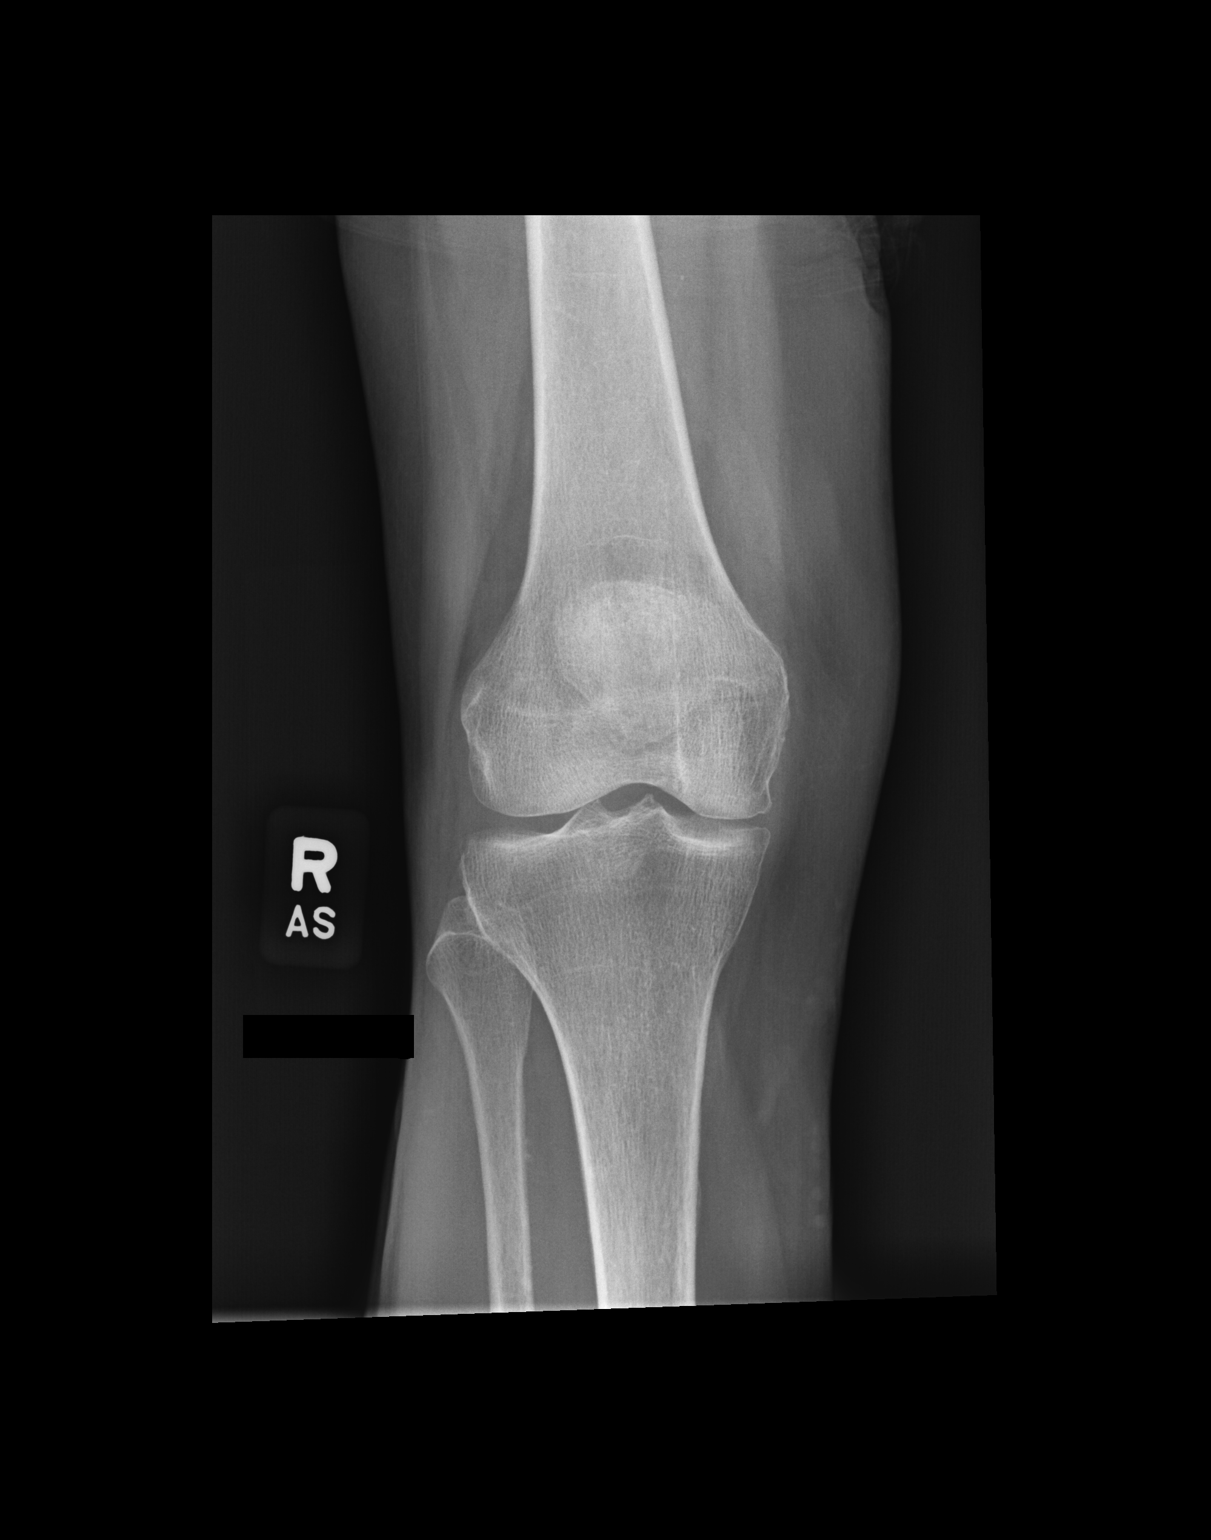

[dg knee 3 views right (2 of 3)]
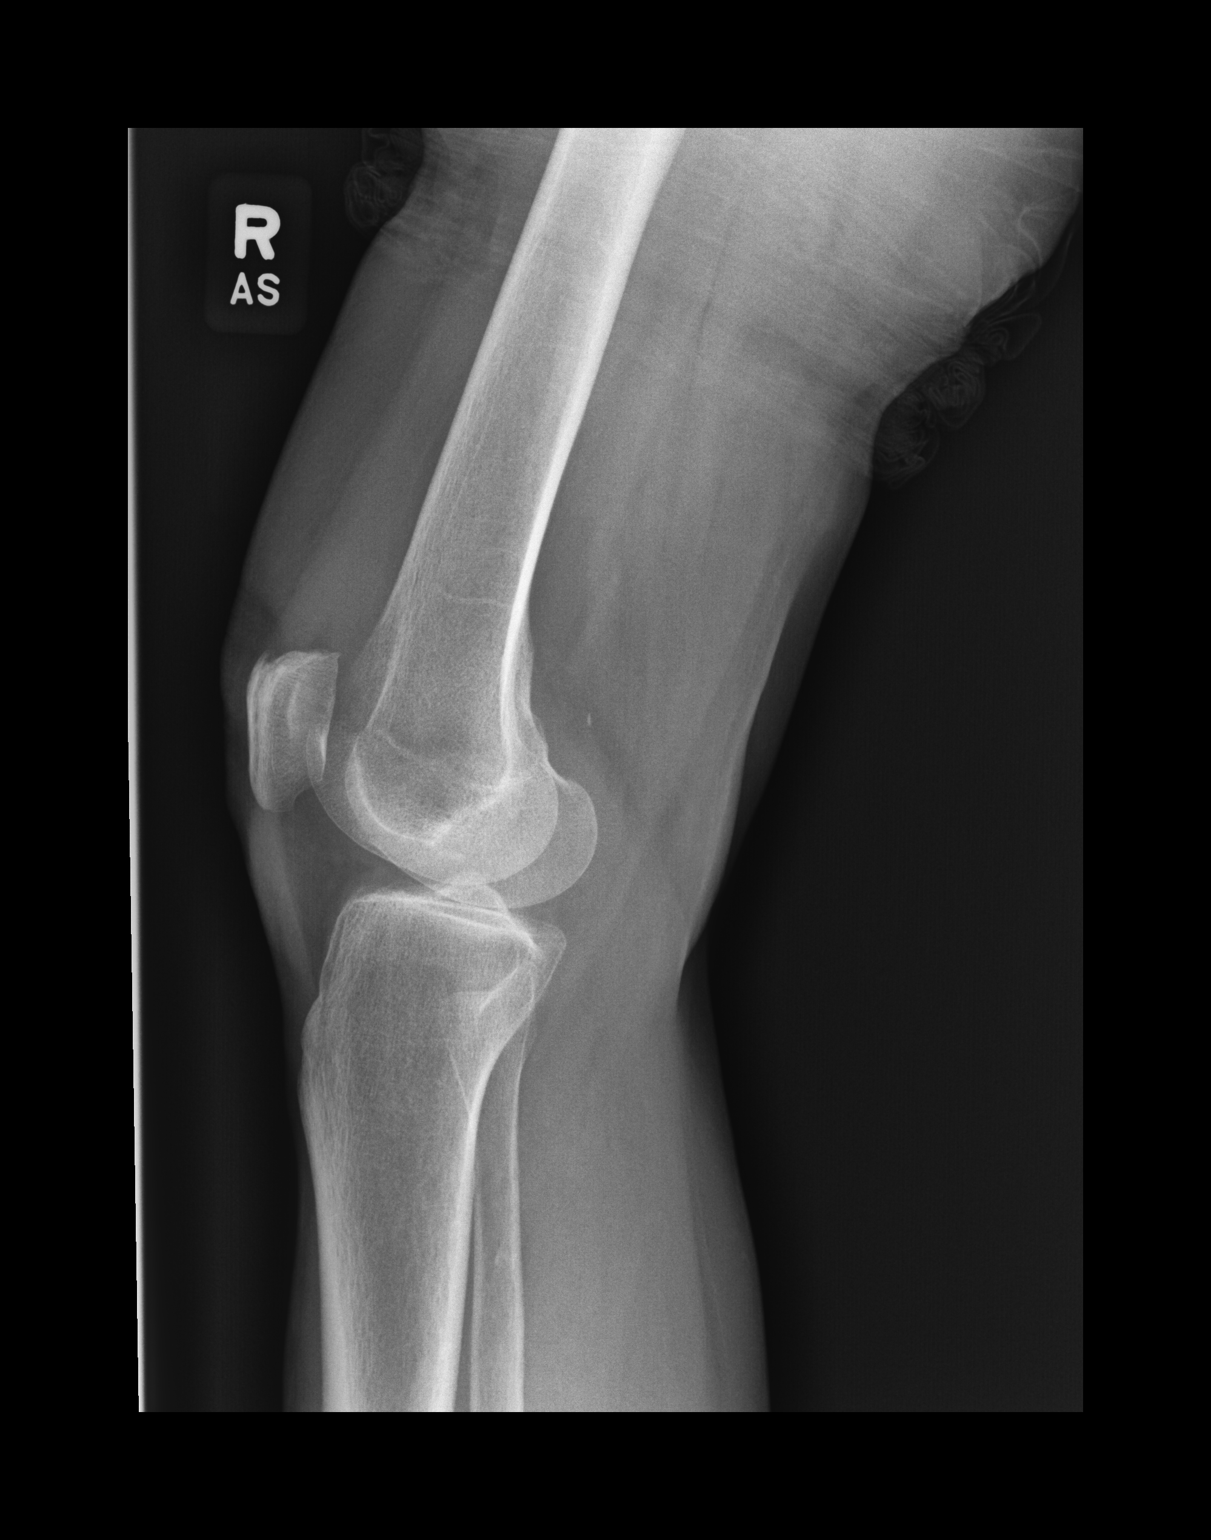

[dg knee 3 views right (3 of 3)]
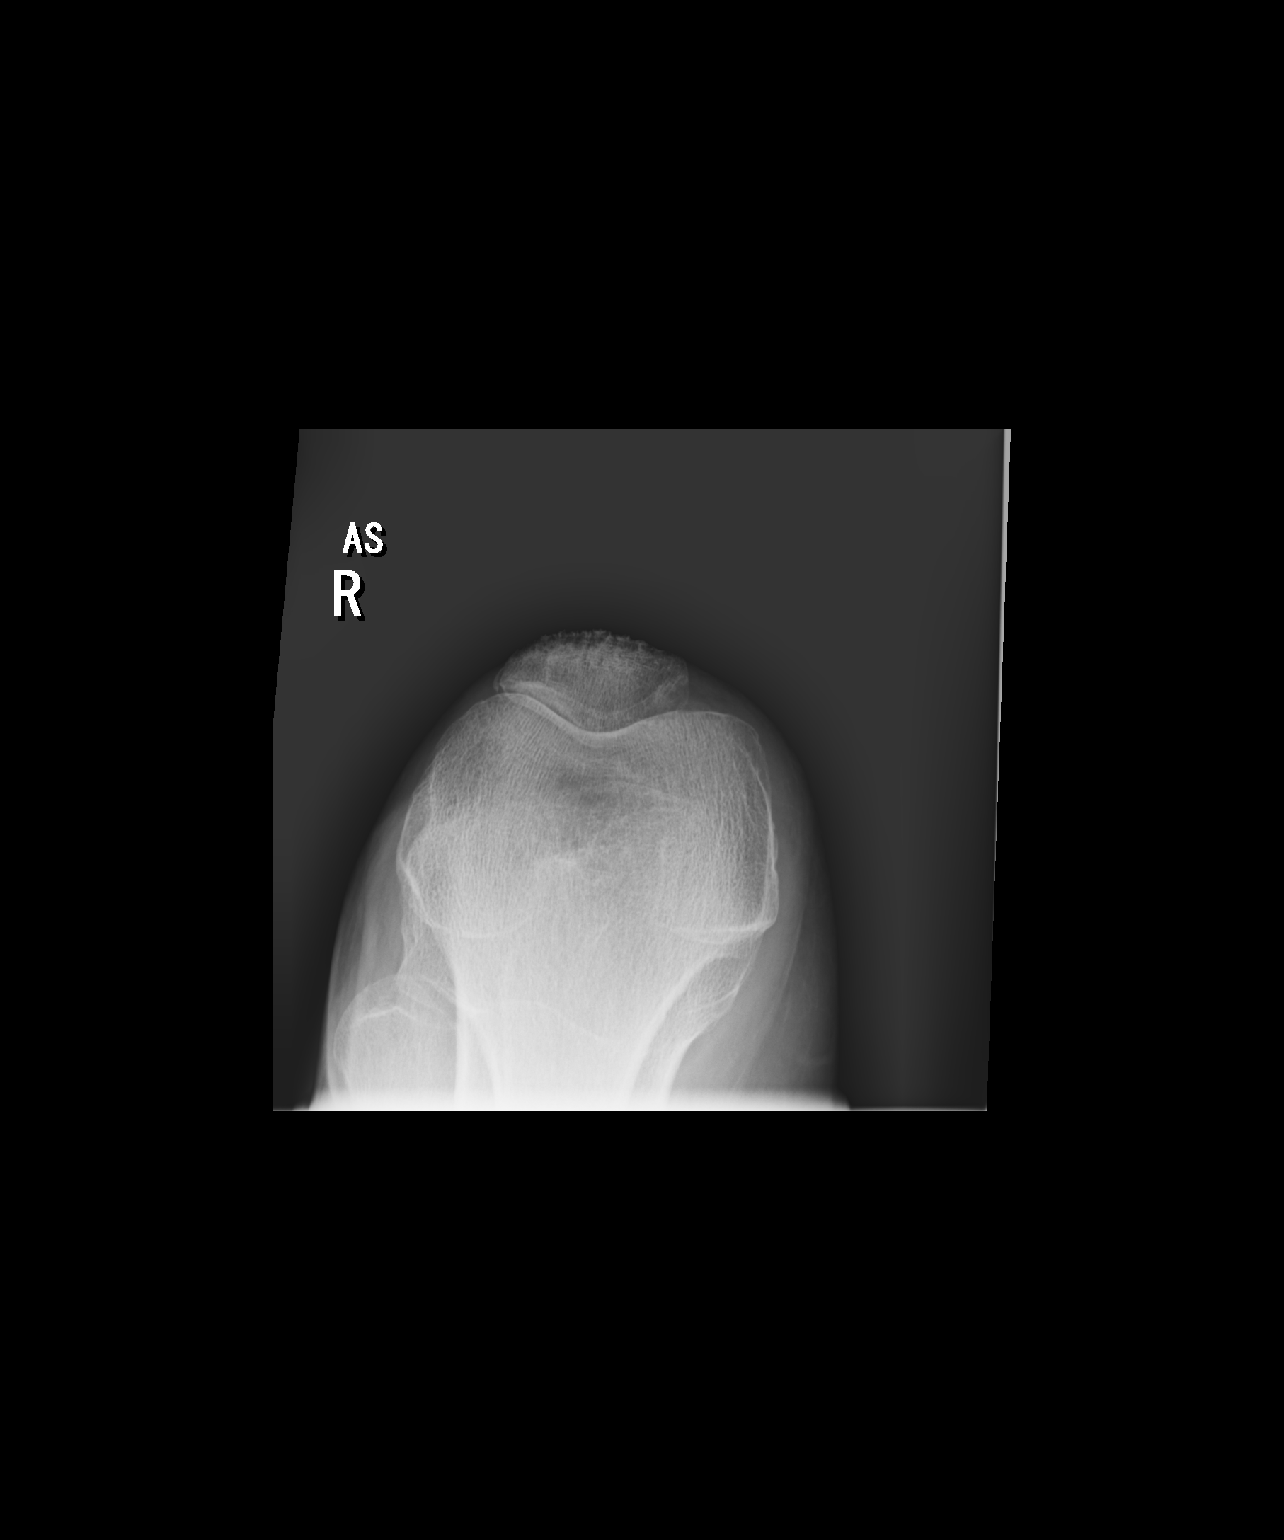

[3 of 3 positions shown; findings below may reference images not displayed]

FINDINGS: No evidence of an acute fracture or dislocation. Mild to moderate
severity medial tibiofemoral compartment space narrowing is seen.
There is a small joint effusion.
IMPRESSION: Small joint effusion without an acute osseous abnormality.

## 2021-12-28 ENCOUNTER — Telehealth: Payer: Medicare Other | Admitting: Nurse Practitioner

## 2021-12-28 DIAGNOSIS — U071 COVID-19: Secondary | ICD-10-CM | POA: Diagnosis not present

## 2021-12-28 NOTE — Progress Notes (Signed)
Virtual Visit Consent   [name], you are scheduled for a virtual visit with a [facility] provider today. Just as with appointments in the office, your consent must be obtained to participate. Your consent will be active for this visit and any virtual visit you may have with one of our providers in the next 365 days. If you have a MyChart account, a copy of this consent can be sent to you electronically.  As this is a virtual visit, video technology does not allow for your provider to perform a traditional examination. This may limit your provider's ability to fully assess your condition. If your provider identifies any concerns that need to be evaluated in person or the need to arrange testing (such as labs, EKG, etc.), we will make arrangements to do so. Although advances in technology are sophisticated, we cannot ensure that it will always work on either your end or our end. If the connection with a video visit is poor, the visit may have to be switched to a telephone visit. With either a video or telephone visit, we are not always able to ensure that we have a secure connection.  By engaging in this virtual visit, you consent to the provision of healthcare and authorize for your insurance to be billed (if applicable) for the services provided during this visit. Depending on your insurance coverage, you may receive a charge related to this service.  I need to obtain your verbal consent now. Are you willing to proceed with your visit today? MARVETTE SCHAMP has provided verbal consent on 12/28/2021 for a virtual visit (video or telephone). Claiborne Rigg, NP  Date: 12/28/2021 9:18 AM  Virtual Visit via Video Note   I, Claiborne Rigg, connected with  Diane Villa  ([id number], April 06, 1946) on 12/28/21 at  8:45 AM EDT by a video-enabled telemedicine application and verified that I am speaking with the correct person using two identifiers.  Location: Patient: Virtual Visit Location Patient:  Home Provider: Virtual Visit Location Provider: Home Office   I discussed the limitations of evaluation and management by telemedicine and the availability of in person appointments. The patient expressed understanding and agreed to proceed.    History of Present Illness: Diane Villa is a 76 y.o. who identifies as a female who was assigned female at birth, and is being seen today for Positive Home COVID test with mild symptoms.  States she went to the beach last week with family. Onset of mild URI symptoms started on Thursday (4 days ago). She tested positive for COVID on her return to home Friday.  Current symptoms include mild Aching, constant sneezing, nasal congestion, dry cough. She is Afebrile and denies headache, trouble breathing, chest pain.    Problems:  Patient Active Problem List   Diagnosis Date Noted   Palpitations 11/12/2021   Preoperative clearance 11/12/2021   Age-related osteoporosis without current pathological fracture 11/13/2020   Anxiety 11/13/2020   Chronic insomnia 11/13/2020   Gastroesophageal reflux disease 11/13/2020   Irritable bowel syndrome 11/13/2020   Microscopic hematuria 11/13/2020   Sacroiliitis, not elsewhere classified (HCC) 11/13/2020   Vitamin D deficiency 11/13/2020   Claudication in peripheral vascular disease (HCC) 09/25/2019   Peripheral arterial disease (HCC) 09/12/2019   Essential hypertension 09/12/2019   Hyperlipidemia 09/12/2019    Allergies:  Allergies  Allergen Reactions   Ambien [Zolpidem Tartrate]    Atorvastatin     Other reaction(s): elevated liver tests   Denosumab  Other reaction(s): consipation   Elemental Sulfur     RASH ON ABDOMEN   Zolpidem     Other reaction(s): too strong   Sulfamethoxazole Rash   Medications:  Current Outpatient Medications:    Acetaminophen (TYLENOL ARTHRITIS PAIN PO), , Disp: , Rfl:    ALPRAZolam (XANAX) 0.5 MG tablet, Take 0.25 mg by mouth 3 (three) times daily as needed for anxiety.  , Disp: , Rfl: 0   amLODipine (NORVASC) 2.5 MG tablet, Take 2.5 mg by mouth at bedtime. , Disp: , Rfl:    ASPIRIN LOW DOSE 81 MG EC tablet, TAKE 1 TABLET BY MOUTH EVERY DAY, Disp: 30 tablet, Rfl: 5   atenolol (TENORMIN) 100 MG tablet, Take 100 mg by mouth at bedtime. , Disp: , Rfl:    cholecalciferol (VITAMIN D) 1000 UNITS tablet, Take 1,000 Units by mouth daily., Disp: , Rfl:    clobetasol ointment (TEMOVATE) 0.05 %, SMARTSIG:Sparingly Topical Daily PRN, Disp: , Rfl:    Coenzyme Q10 (CO Q-10) 200 MG CAPS, See admin instructions., Disp: , Rfl:    COVID-19 mRNA bivalent vaccine, Pfizer, (PFIZER COVID-19 VAC BIVALENT) injection, Inject into the muscle., Disp: 0.3 mL, Rfl: 0   diclofenac Sodium (VOLTAREN) 1 % GEL, as needed., Disp: , Rfl:    omeprazole (PRILOSEC) 40 MG capsule, Take 1 capsule by mouth as needed., Disp: , Rfl:    Propylene Glycol (SYSTANE COMPLETE) 0.6 % SOLN, Place 1 drop into both eyes 3 (three) times daily as needed (dry/irritated eyes.)., Disp: , Rfl:    rosuvastatin (CRESTOR) 10 MG tablet, Take 10 mg by mouth daily., Disp: , Rfl:    valsartan (DIOVAN) 320 MG tablet, 1 tablet, Disp: , Rfl:   Observations/Objective: Patient is well-developed, well-nourished in no acute distress.  Resting comfortably at home.  Head is normocephalic, atraumatic.  No labored breathing.  Speech is clear and coherent with logical content.  Patient is alert and oriented at baseline.    Assessment and Plan: 1. Positive self-administered antigen test for COVID-19 Continue home treatments (motrin, tylenol, coricidin HBP for cough) No antiviral sent today due to mild symptoms with improvement today.   Follow Up Instructions: I discussed the assessment and treatment plan with the patient. The patient was provided an opportunity to ask questions and all were answered. The patient agreed with the plan and demonstrated an understanding of the instructions.  A copy of instructions were sent to the  patient via MyChart unless otherwise noted below.   The patient was advised to call back or seek an in-person evaluation if the symptoms worsen or if the condition fails to improve as anticipated.  Time:  I spent 13 minutes with the patient via telehealth technology discussing the above problems/concerns.    Claiborne Rigg, NP

## 2021-12-28 NOTE — Patient Instructions (Signed)
Lennette Bihari, thank you for joining Claiborne Rigg, NP for today's virtual visit.  While this provider is not your primary care provider (PCP), if your PCP is located in our provider database this encounter information will be shared with them immediately following your visit.  Consent: (Patient) Lennette Bihari provided verbal consent for this virtual visit at the beginning of the encounter.  Current Medications:  Current Outpatient Medications:    Acetaminophen (TYLENOL ARTHRITIS PAIN PO), , Disp: , Rfl:    ALPRAZolam (XANAX) 0.5 MG tablet, Take 0.25 mg by mouth 3 (three) times daily as needed for anxiety. , Disp: , Rfl: 0   amLODipine (NORVASC) 2.5 MG tablet, Take 2.5 mg by mouth at bedtime. , Disp: , Rfl:    ASPIRIN LOW DOSE 81 MG EC tablet, TAKE 1 TABLET BY MOUTH EVERY DAY, Disp: 30 tablet, Rfl: 5   atenolol (TENORMIN) 100 MG tablet, Take 100 mg by mouth at bedtime. , Disp: , Rfl:    cholecalciferol (VITAMIN D) 1000 UNITS tablet, Take 1,000 Units by mouth daily., Disp: , Rfl:    clobetasol ointment (TEMOVATE) 0.05 %, SMARTSIG:Sparingly Topical Daily PRN, Disp: , Rfl:    Coenzyme Q10 (CO Q-10) 200 MG CAPS, See admin instructions., Disp: , Rfl:    COVID-19 mRNA bivalent vaccine, Pfizer, (PFIZER COVID-19 VAC BIVALENT) injection, Inject into the muscle., Disp: 0.3 mL, Rfl: 0   diclofenac Sodium (VOLTAREN) 1 % GEL, as needed., Disp: , Rfl:    omeprazole (PRILOSEC) 40 MG capsule, Take 1 capsule by mouth as needed., Disp: , Rfl:    Propylene Glycol (SYSTANE COMPLETE) 0.6 % SOLN, Place 1 drop into both eyes 3 (three) times daily as needed (dry/irritated eyes.)., Disp: , Rfl:    rosuvastatin (CRESTOR) 10 MG tablet, Take 10 mg by mouth daily., Disp: , Rfl:    valsartan (DIOVAN) 320 MG tablet, 1 tablet, Disp: , Rfl:    Medications ordered in this encounter:  No orders of the defined types were placed in this encounter.    *If you need refills on other medications prior to your next  appointment, please contact your pharmacy*  Follow-Up: Call back or seek an in-person evaluation if the symptoms worsen or if the condition fails to improve as anticipated.  Other Instructions Please keep well-hydrated and get plenty of rest. Start a saline nasal rinse to flush out your nasal passages. You can use plain Mucinex to help thin congestion as well as a humidifier.    You were to quarantine for 5 days from onset of your symptoms.  After day 5, if you have had no fever and you are feeling better, you can end quarantine but need to mask for an additional 5 days. After day 5 if you have a fever or are having significant symptoms, please quarantine for full 10 days.   If you note any worsening of symptoms, any significant shortness of breath or any chest pain, please seek ER evaluation ASAP.  Please do not delay care!    If you have been instructed to have an in-person evaluation today at a local Urgent Care facility, please use the link below. It will take you to a list of all of our available [facility], including address, phone number and hours of operation. Please do not delay care.  [facility]  If you or a family member do not have a primary care provider, use the link below to schedule a visit and establish  care. When you choose a [facility] primary care physician or advanced practice provider, you gain a long-term partner in health. Find a Primary Care Provider  Learn more about [facility] [facility] [place]'s in-office and virtual care options: [facility]

## 2021-12-29 ENCOUNTER — Ambulatory Visit: Payer: Medicare Other

## 2022-01-09 ENCOUNTER — Ambulatory Visit: Payer: Medicare Other

## 2022-01-19 ENCOUNTER — Ambulatory Visit
Admission: RE | Admit: 2022-01-19 | Discharge: 2022-01-19 | Disposition: A | Discharge status: Home / Self Care | Payer: Medicare Other | Source: Ambulatory Visit | Attending: Family Medicine | Admitting: Family Medicine

## 2022-01-19 DIAGNOSIS — Z1231 Encounter for screening mammogram for malignant neoplasm of breast: Secondary | ICD-10-CM | POA: Diagnosis not present

## 2022-01-26 ENCOUNTER — Ambulatory Visit: Payer: Medicare Other | Admitting: Podiatry

## 2022-01-26 ENCOUNTER — Ambulatory Visit (INDEPENDENT_AMBULATORY_CARE_PROVIDER_SITE_OTHER): Payer: Medicare Other

## 2022-01-26 ENCOUNTER — Encounter: Payer: Self-pay | Admitting: Podiatry

## 2022-01-26 DIAGNOSIS — M79671 Pain in right foot: Secondary | ICD-10-CM | POA: Diagnosis not present

## 2022-01-28 NOTE — Progress Notes (Signed)
Subjective:   Patient ID: Diane Villa, female   DOB: 76 y.o.   MRN: [id number]   HPI Patient presents with very painful big toe joint right states that she needs to get it fixed as the relief is only short-term now   ROS      Objective:  Physical Exam  Neuro vascular status intact with patient found to have reduced range of motion but no crepitus of the joint when I move the first MPJ right with swelling around the joint surface     Assessment:  Chronic arthritis of the first MPJ right with pain of the joint     Plan:  H&P reviewed x-rays discussed treatment options in a perfect world if the cartilage is viable I have recommended osteotomy to shorten the bone and remove the spur.  If not implant procedure can be done and I explained the surgery to patient this is what she wants over a fusion and I explained alternative treatments complications and after review she signed consent form understanding that there is no guarantees of the success and that if we do do a repair it is possible 1 point the future she could need implant or fusion.  She understands all this wants surgery signed consent form scheduled for outpatient procedure and boot was dispensed and fitted properly to her lower leg and I like for her to wear it prior to surgery to get used to it  X-rays do indicate narrowing of the joint surface first MPJ dorsal spurring elevation of the first metatarsal segment

## 2022-01-29 ENCOUNTER — Telehealth: Payer: Self-pay | Admitting: Urology

## 2022-01-29 NOTE — Telephone Encounter (Signed)
DOS - 02/17/22  [place] BUNIONECTOMY RIGHT --- [phone]  [facility] EFFECTIVE DATE - 06/08/21  PLAN DEDUCTIBLE - $0.00 OUT OF POCKET - $3,900.00 W/ $3,657.19 REMAINING COINSURANCE - 0% COPAY - $295.00   PER [employer]HC WEBSITE FOR CPT CODE 72094 Notification or Prior Authorization is not required for the requested services  This [employer] Medicare Advantage members plan does not currently require a prior authorization for these services. If you have general questions about the prior authorization requirements, please call us at [phone] or visit [email or web address] > Clinician Resources > Advance and Admission Notification Requirements. The number above acknowledges your notification. Please write this number down for future reference. Notification is not a guarantee of coverage or payment.  Decision ID #:[id number]

## 2022-02-13 ENCOUNTER — Other Ambulatory Visit: Payer: Self-pay | Admitting: Family Medicine

## 2022-02-13 DIAGNOSIS — E559 Vitamin D deficiency, unspecified: Secondary | ICD-10-CM | POA: Diagnosis not present

## 2022-02-13 DIAGNOSIS — Z0000 Encounter for general adult medical examination without abnormal findings: Secondary | ICD-10-CM | POA: Diagnosis not present

## 2022-02-13 DIAGNOSIS — K589 Irritable bowel syndrome without diarrhea: Secondary | ICD-10-CM | POA: Diagnosis not present

## 2022-02-13 DIAGNOSIS — I10 Essential (primary) hypertension: Secondary | ICD-10-CM | POA: Diagnosis not present

## 2022-02-13 DIAGNOSIS — M810 Age-related osteoporosis without current pathological fracture: Secondary | ICD-10-CM | POA: Diagnosis not present

## 2022-02-13 DIAGNOSIS — K219 Gastro-esophageal reflux disease without esophagitis: Secondary | ICD-10-CM | POA: Diagnosis not present

## 2022-02-13 DIAGNOSIS — E785 Hyperlipidemia, unspecified: Secondary | ICD-10-CM | POA: Diagnosis not present

## 2022-02-13 DIAGNOSIS — Z23 Encounter for immunization: Secondary | ICD-10-CM | POA: Diagnosis not present

## 2022-02-13 DIAGNOSIS — I739 Peripheral vascular disease, unspecified: Secondary | ICD-10-CM | POA: Diagnosis not present

## 2022-02-13 DIAGNOSIS — Z1231 Encounter for screening mammogram for malignant neoplasm of breast: Secondary | ICD-10-CM

## 2022-02-16 MED ORDER — ONDANSETRON HCL 4 MG PO TABS: 4.0000 mg | ORAL_TABLET | Freq: Three times a day (TID) | ORAL | 0 refills | Status: DC | PRN

## 2022-02-16 MED ORDER — HYDROMORPHONE HCL 4 MG PO TABS: 4.0000 mg | ORAL_TABLET | ORAL | 0 refills | Status: DC | PRN

## 2022-02-16 NOTE — Addendum Note (Signed)
Addended by: Lenn Sink on: 02/16/2022 01:26 PM   Modules accepted: Orders

## 2022-02-17 ENCOUNTER — Encounter: Payer: Self-pay | Admitting: Podiatry

## 2022-02-17 DIAGNOSIS — M2021 Hallux rigidus, right foot: Secondary | ICD-10-CM | POA: Diagnosis not present

## 2022-02-18 ENCOUNTER — Other Ambulatory Visit: Payer: Self-pay

## 2022-02-18 ENCOUNTER — Other Ambulatory Visit: Payer: Self-pay | Admitting: Podiatry

## 2022-02-18 ENCOUNTER — Telehealth: Payer: Self-pay | Admitting: *Deleted

## 2022-02-18 MED ORDER — HYDROMORPHONE HCL 4 MG PO TABS: 4.0000 mg | ORAL_TABLET | ORAL | 0 refills | Status: DC | PRN

## 2022-02-18 NOTE — Telephone Encounter (Signed)
Patient is calling to request pain medicine be resent to [facility], out at the pharmacy on [address]. Please advise.

## 2022-02-18 NOTE — Telephone Encounter (Signed)
Patient called back to see if Dr. Charlsie Merles sent medication to pharmacy. I told patient I would call back as soon as I heard from the provider

## 2022-02-18 NOTE — Telephone Encounter (Signed)
Spoke to the patient and she stated that the [facility] at [address] doesn't have the Hydromorphone in stock but the [facility] on does. Patient wants to know if you can send the medication to the [facility] on [address].

## 2022-02-18 NOTE — Telephone Encounter (Signed)
Sent in dilaudid

## 2022-02-23 ENCOUNTER — Encounter: Payer: Self-pay | Admitting: Podiatry

## 2022-02-23 ENCOUNTER — Ambulatory Visit (INDEPENDENT_AMBULATORY_CARE_PROVIDER_SITE_OTHER): Payer: Medicare Other

## 2022-02-23 ENCOUNTER — Ambulatory Visit (INDEPENDENT_AMBULATORY_CARE_PROVIDER_SITE_OTHER): Payer: Medicare Other | Admitting: Podiatry

## 2022-02-23 DIAGNOSIS — Z9889 Other specified postprocedural states: Secondary | ICD-10-CM

## 2022-02-24 NOTE — Progress Notes (Signed)
Subjective:   Patient ID: Rush Barer, female   DOB: 76 y.o.   MRN: [id number]   HPI Patient presents stating she is doing well mild discomfort but overall very pleased   ROS      Objective:  Physical Exam  Neurovascular status intact negative Bevelyn Buckles' sign noted wound edges well coapted first MPJ healing well good range of motion no pathology noted     Assessment:  Doing well post osteotomy right first metatarsal     Plan:  H&P reviewed condition recommended range of motion exercises to begin now continued elevation compression immobilization reappoint 3 weeks or earlier if needed  X-rays indicate fixation in place osteotomies healing well good plantarflexion noted first metatarsal segment

## 2022-03-11 DIAGNOSIS — L439 Lichen planus, unspecified: Secondary | ICD-10-CM | POA: Diagnosis not present

## 2022-03-11 DIAGNOSIS — Z6820 Body mass index (BMI) 20.0-20.9, adult: Secondary | ICD-10-CM | POA: Diagnosis not present

## 2022-03-11 DIAGNOSIS — M810 Age-related osteoporosis without current pathological fracture: Secondary | ICD-10-CM | POA: Diagnosis not present

## 2022-03-18 ENCOUNTER — Ambulatory Visit (INDEPENDENT_AMBULATORY_CARE_PROVIDER_SITE_OTHER): Payer: Medicare Other

## 2022-03-18 ENCOUNTER — Ambulatory Visit: Payer: Medicare Other | Admitting: Podiatry

## 2022-03-18 ENCOUNTER — Encounter: Payer: Self-pay | Admitting: Podiatry

## 2022-03-18 DIAGNOSIS — Z9889 Other specified postprocedural states: Secondary | ICD-10-CM

## 2022-03-18 NOTE — Progress Notes (Signed)
Subjective:   Patient ID: Diane Villa, female   DOB: 76 y.o.   MRN: [id number]   HPI Patient presents stating that she is doing much better very pleased so far with surgery minimal discomfort neuro   ROS      Objective:  Physical Exam  Vascular status intact muscle strength adequate with the patient right first MPJ still showing moderate diminishment of the motion no pain within the joint surface doing well post     Assessment:  Surgery for hallux limitus deformity right     Plan:  Reviewed condition and recommended the continuation of conservative care gradual return to soft shoe dispense ankle compression stocking encouraged range of motion exercises reappoint to recheck  X-rays indicate the osteotomy is healing well the joint is congruent as rounded with no other pathology noted

## 2022-03-22 ENCOUNTER — Telehealth: Payer: Medicare Other | Admitting: Nurse Practitioner

## 2022-03-22 DIAGNOSIS — R399 Unspecified symptoms and signs involving the genitourinary system: Secondary | ICD-10-CM

## 2022-03-22 MED ORDER — CEPHALEXIN 500 MG PO CAPS: 500.0000 mg | ORAL_CAPSULE | Freq: Two times a day (BID) | ORAL | 0 refills | Status: AC

## 2022-03-22 NOTE — Progress Notes (Signed)
Virtual Visit Consent   Diane Villa, you are scheduled for a virtual visit with a [facility] provider today. Just as with appointments in the office, your consent must be obtained to participate. Your consent will be active for this visit and any virtual visit you may have with one of our providers in the next 365 days. If you have a MyChart account, a copy of this consent can be sent to you electronically.  As this is a virtual visit, video technology does not allow for your provider to perform a traditional examination. This may limit your provider's ability to fully assess your condition. If your provider identifies any concerns that need to be evaluated in person or the need to arrange testing (such as labs, EKG, etc.), we will make arrangements to do so. Although advances in technology are sophisticated, we cannot ensure that it will always work on either your end or our end. If the connection with a video visit is poor, the visit may have to be switched to a telephone visit. With either a video or telephone visit, we are not always able to ensure that we have a secure connection.  By engaging in this virtual visit, you consent to the provision of healthcare and authorize for your insurance to be billed (if applicable) for the services provided during this visit. Depending on your insurance coverage, you may receive a charge related to this service.  I need to obtain your verbal consent now. Are you willing to proceed with your visit today? CAPRICE MCCAFFREY has provided verbal consent on 03/22/2022 for a virtual visit (video or telephone). Gildardo Pounds, NP  Date: 03/22/2022 10:20 AM  Virtual Visit via Video Note   I, Gildardo Pounds, connected with  Diane Villa  ([id number], 1945/06/10) on 03/22/22 at 10:00 AM EDT by a video-enabled telemedicine application and verified that I am speaking with the correct person using two identifiers.  Location: Patient: Virtual Visit Location Patient:  Home Provider: Virtual Visit Location Provider: Home Office   I discussed the limitations of evaluation and management by telemedicine and the availability of in person appointments. The patient expressed understanding and agreed to proceed.    History of Present Illness: Diane Villa is a 76 y.o. who identifies as a female who was assigned female at birth, and is being seen today for UTI symptoms.   [name] is currently experiencing urinary frequency and mild hematuria which she reports are symptoms of UTI for her. She has been treated for UTI in the past with abx when these symptoms occurred and they did resolve with completion of abx   Problems:  Patient Active Problem List   Diagnosis Date Noted   Palpitations 11/12/2021   Preoperative clearance 11/12/2021   Age-related osteoporosis without current pathological fracture 11/13/2020   Anxiety 11/13/2020   Chronic insomnia 11/13/2020   Gastroesophageal reflux disease 11/13/2020   Irritable bowel syndrome 11/13/2020   Microscopic hematuria 11/13/2020   Sacroiliitis, not elsewhere classified ([place]) 11/13/2020   Vitamin D deficiency 11/13/2020   Claudication in peripheral vascular disease ([place]) 09/25/2019   Peripheral arterial disease ([place]) 09/12/2019   Essential hypertension 09/12/2019   Hyperlipidemia 09/12/2019    Allergies:  Allergies  Allergen Reactions   Ambien [Zolpidem Tartrate]    Atorvastatin     Other reaction(s): elevated liver tests   Denosumab     Other reaction(s): consipation   Elemental Sulfur     RASH ON ABDOMEN   Zolpidem  Other reaction(s): too strong   Sulfamethoxazole Rash   Medications:  Current Outpatient Medications:    cephALEXin (KEFLEX) 500 MG capsule, Take 1 capsule (500 mg total) by mouth 2 (two) times daily for 7 days., Disp: 14 capsule, Rfl: 0   Acetaminophen (TYLENOL ARTHRITIS PAIN PO), , Disp: , Rfl:    ALPRAZolam (XANAX) 0.5 MG tablet, Take 0.25 mg by mouth 3 (three) times daily  as needed for anxiety. , Disp: , Rfl: 0   amLODipine (NORVASC) 2.5 MG tablet, Take 2.5 mg by mouth at bedtime. , Disp: , Rfl:    ASPIRIN LOW DOSE 81 MG EC tablet, TAKE 1 TABLET BY MOUTH EVERY DAY, Disp: 30 tablet, Rfl: 5   atenolol (TENORMIN) 100 MG tablet, Take 100 mg by mouth at bedtime. , Disp: , Rfl:    cholecalciferol (VITAMIN D) 1000 UNITS tablet, Take 1,000 Units by mouth daily., Disp: , Rfl:    clobetasol ointment (TEMOVATE) 0.05 %, SMARTSIG:Sparingly Topical Daily PRN, Disp: , Rfl:    Coenzyme Q10 (CO Q-10) 200 MG CAPS, See admin instructions., Disp: , Rfl:    COVID-19 mRNA bivalent vaccine, Pfizer, (PFIZER COVID-19 VAC BIVALENT) injection, Inject into the muscle., Disp: 0.3 mL, Rfl: 0   diclofenac Sodium (VOLTAREN) 1 % GEL, as needed., Disp: , Rfl:    omeprazole (PRILOSEC) 40 MG capsule, Take 1 capsule by mouth as needed., Disp: , Rfl:    Propylene Glycol (SYSTANE COMPLETE) 0.6 % SOLN, Place 1 drop into both eyes 3 (three) times daily as needed (dry/irritated eyes.)., Disp: , Rfl:    rosuvastatin (CRESTOR) 10 MG tablet, Take 10 mg by mouth daily., Disp: , Rfl:    valsartan (DIOVAN) 320 MG tablet, 1 tablet, Disp: , Rfl:   Observations/Objective: Patient is well-developed, well-nourished in no acute distress.  Resting comfortably at home.  Head is normocephalic, atraumatic.  No labored breathing.  Speech is clear and coherent with logical content.  Patient is alert and oriented at baseline.    Assessment and Plan: 1. UTI symptoms - cephALEXin (KEFLEX) 500 MG capsule; Take 1 capsule (500 mg total) by mouth 2 (two) times daily for 7 days.  Dispense: 14 capsule; Refill: 0   Follow Up Instructions: I discussed the assessment and treatment plan with the patient. The patient was provided an opportunity to ask questions and all were answered. The patient agreed with the plan and demonstrated an understanding of the instructions.  A copy of instructions were sent to the patient via  MyChart unless otherwise noted below.    The patient was advised to call back or seek an in-person evaluation if the symptoms worsen or if the condition fails to improve as anticipated.  Time:  I spent 11 minutes with the patient via telehealth technology discussing the above problems/concerns.    Claiborne Rigg, NP

## 2022-03-30 DIAGNOSIS — H5203 Hypermetropia, bilateral: Secondary | ICD-10-CM | POA: Diagnosis not present

## 2022-03-30 DIAGNOSIS — H52223 Regular astigmatism, bilateral: Secondary | ICD-10-CM | POA: Diagnosis not present

## 2022-03-30 DIAGNOSIS — H35033 Hypertensive retinopathy, bilateral: Secondary | ICD-10-CM | POA: Diagnosis not present

## 2022-03-30 DIAGNOSIS — H524 Presbyopia: Secondary | ICD-10-CM | POA: Diagnosis not present

## 2022-03-30 DIAGNOSIS — H26493 Other secondary cataract, bilateral: Secondary | ICD-10-CM | POA: Diagnosis not present

## 2022-03-30 DIAGNOSIS — I10 Essential (primary) hypertension: Secondary | ICD-10-CM | POA: Diagnosis not present

## 2022-04-13 ENCOUNTER — Other Ambulatory Visit (HOSPITAL_BASED_OUTPATIENT_CLINIC_OR_DEPARTMENT_OTHER): Payer: Self-pay

## 2022-04-13 MED ORDER — COMIRNATY 30 MCG/0.3ML IM SUSY
PREFILLED_SYRINGE | INTRAMUSCULAR | 0 refills | Status: AC
  Filled 2022-04-13: qty 0.3, 1d supply, fill #0

## 2022-04-27 ENCOUNTER — Other Ambulatory Visit (HOSPITAL_BASED_OUTPATIENT_CLINIC_OR_DEPARTMENT_OTHER): Payer: Self-pay

## 2022-04-27 MED ORDER — AREXVY 120 MCG/0.5ML IM SUSR
INTRAMUSCULAR | 0 refills | Status: AC
  Filled 2022-04-27: qty 0.5, 1d supply, fill #0

## 2022-04-29 ENCOUNTER — Ambulatory Visit (INDEPENDENT_AMBULATORY_CARE_PROVIDER_SITE_OTHER): Payer: Medicare Other

## 2022-04-29 ENCOUNTER — Ambulatory Visit (INDEPENDENT_AMBULATORY_CARE_PROVIDER_SITE_OTHER): Payer: Medicare Other | Admitting: Orthopaedic Surgery

## 2022-04-29 DIAGNOSIS — M25551 Pain in right hip: Secondary | ICD-10-CM | POA: Diagnosis not present

## 2022-04-29 MED ORDER — LIDOCAINE HCL 1 % IJ SOLN
4.0000 mL | INTRAMUSCULAR | Status: AC | PRN
  Administered 2022-04-29: 4 mL

## 2022-04-29 MED ORDER — TRIAMCINOLONE ACETONIDE 40 MG/ML IJ SUSP
80.0000 mg | INTRAMUSCULAR | Status: AC | PRN
  Administered 2022-04-29: 80 mg via INTRA_ARTICULAR

## 2022-04-29 NOTE — Progress Notes (Signed)
Chief Complaint: Right hip pain     History of Present Illness:    Diane Villa is a 76 y.o. female presents today with right hip pain which has been ongoing and consistent for multiple years.  She does have a history of wrist arthritis that ultimately required pinning on September 12 and as result within a boot following this.  She has subsequently had some flareups of her Knee and back/hip.  She states that the pain can occasionally be very bad and limit her ability to walk or be more active.  She is here today for further assessment.  She has not previously had any injections or physical therapy    Surgical History:   None  PMH/PSH/Family History/Social History/Meds/Allergies:    Past Medical History:  Diagnosis Date  . Anxiety   . Chronic back pain   . GERD (gastroesophageal reflux disease)   . Hematuria   . Hypercholesteremia   . Hypertension   . Mitral valve prolapse   . Osteoarthritis    right great toe  . Osteopenia   . Vitamin D deficiency    Past Surgical History:  Procedure Laterality Date  . ABDOMINAL AORTOGRAM W/LOWER EXTREMITY Bilateral 09/25/2019   Procedure: ABDOMINAL AORTOGRAM W/LOWER EXTREMITY;  Surgeon: Runell Gess, MD;  Location: [facility] INVASIVE CV LAB;  Service: Cardiovascular;  Laterality: Bilateral;  . APPENDECTOMY    . COLONOSCOPY    . PERIPHERAL VASCULAR ATHERECTOMY Right 09/25/2019   Procedure: PERIPHERAL VASCULAR ATHERECTOMY;  Surgeon: Runell Gess, MD;  Location: [facility] INVASIVE CV LAB;  Service: Cardiovascular;  Laterality: Right;  SFA with DCB  . TUBAL LIGATION     Social History   Socioeconomic History  . Marital status: Widowed    Spouse name: Not on file  . Number of children: 2  . Years of education: Not on file  . Highest education level: Not on file  Occupational History  . Occupation: retired    Comment: accounts payable  Tobacco Use  . Smoking status: Never  . Smokeless tobacco: Never   Vaping Use  . Vaping Use: Never used  Substance and Sexual Activity  . Alcohol use: No  . Drug use: No  . Sexual activity: Not on file  Other Topics Concern  . Not on file  Social History Narrative  . Not on file   Social Determinants of Health   Financial Resource Strain: Not on file  Food Insecurity: Not on file  Transportation Needs: Not on file  Physical Activity: Not on file  Stress: Not on file  Social Connections: Not on file   Family History  Problem Relation Age of Onset  . Diabetes Mother   . Heart failure Mother   . Other Father        killled in an accident  . Heart disease Sister   . Stroke Maternal Grandmother   . Colon cancer Neg Hx   . Breast cancer Neg Hx   . Stomach cancer Neg Hx   . Liver cancer Neg Hx    Allergies  Allergen Reactions  . Ambien [Zolpidem Tartrate]   . Atorvastatin     Other reaction(s): elevated liver tests  . Denosumab     Other reaction(s): consipation  . Elemental Sulfur     RASH ON ABDOMEN  . Zolpidem  Other reaction(s): too strong  . Sulfamethoxazole Rash   Current Outpatient Medications  Medication Sig Dispense Refill  . Acetaminophen (TYLENOL ARTHRITIS PAIN PO)     . ALPRAZolam (XANAX) 0.5 MG tablet Take 0.25 mg by mouth 3 (three) times daily as needed for anxiety.   0  . amLODipine (NORVASC) 2.5 MG tablet Take 2.5 mg by mouth at bedtime.     . ASPIRIN LOW DOSE 81 MG EC tablet TAKE 1 TABLET BY MOUTH EVERY DAY 30 tablet 5  . atenolol (TENORMIN) 100 MG tablet Take 100 mg by mouth at bedtime.     . cholecalciferol (VITAMIN D) 1000 UNITS tablet Take 1,000 Units by mouth daily.    . clobetasol ointment (TEMOVATE) 0.05 % SMARTSIG:Sparingly Topical Daily PRN    . Coenzyme Q10 (CO Q-10) 200 MG CAPS See admin instructions.    Marland Kitchen COVID-19 mRNA bivalent vaccine, Pfizer, (PFIZER COVID-19 VAC BIVALENT) injection Inject into the muscle. 0.3 mL 0  . COVID-19 mRNA vaccine 2023-2024 (COMIRNATY) syringe Inject into the muscle. 0.3  mL 0  . diclofenac Sodium (VOLTAREN) 1 % GEL as needed.    Marland Kitchen omeprazole (PRILOSEC) 40 MG capsule Take 1 capsule by mouth as needed.    Marland Kitchen Propylene Glycol (SYSTANE COMPLETE) 0.6 % SOLN Place 1 drop into both eyes 3 (three) times daily as needed (dry/irritated eyes.).    Marland Kitchen rosuvastatin (CRESTOR) 10 MG tablet Take 10 mg by mouth daily.    . RSV vaccine recomb adjuvanted (AREXVY) 120 MCG/0.5ML injection Inject into the muscle. 0.5 mL 0  . valsartan (DIOVAN) 320 MG tablet 1 tablet     No current facility-administered medications for this visit.   No results found.  Review of Systems:   A ROS was performed including pertinent positives and negatives as documented in the HPI.  Physical Exam :   Constitutional: NAD and appears stated age Neurological: Alert and oriented Psych: Appropriate affect and cooperative There were no vitals taken for this visit.   Comprehensive Musculoskeletal Exam:    Tenderness to palpation about the groin and back with 100 degrees of flexion and 20 degrees of internal rotation.  There is approximately 20 degrees of external rotation.  She is unable to go to the figure-of-four position compared to the contralateral side.  Imaging:   Xray (4 views right hip): Early osteoarthritic findings involving the right femoral acetabular joint  I personally reviewed and interpreted the radiographs.   Assessment:   76 y.o. female with right early hip osteoarthritis.  To that effect I do believe that she would benefit very well from a right hip ultrasound-guided injection.  She would like to proceed with this today.  I will plan to see her back on an as-needed basis  Plan :    -Right hip ultrasound-guided femoral acetabular injection performed after verbal consent obtained    Procedure Note  Patient: Diane Villa             Date of Birth: 05/01/1946           MRN: [id number]             Visit Date: 04/29/2022  Procedures: Visit Diagnoses:  1. Pain in right hip      Large Joint Inj: R hip joint on 04/29/2022 3:14 PM Indications: pain Details: 22 G 3.5 in needle, ultrasound-guided anterolateral approach  Arthrogram: No  Medications: 4 mL lidocaine 1 %; 80 mg triamcinolone acetonide 40 MG/ML Outcome: tolerated well, no immediate complications Procedure,  treatment alternatives, risks and benefits explained, specific risks discussed. Consent was given by the patient. Immediately prior to procedure a time out was called to verify the correct patient, procedure, equipment, support staff and site/side marked as required. Patient was prepped and draped in the usual sterile fashion.          I personally saw and evaluated the patient, and participated in the management and treatment plan.  Huel Cote, MD Attending Physician, Orthopedic Surgery  This document was dictated using Dragon voice recognition software. A reasonable attempt at proof reading has been made to minimize errors.

## 2022-05-22 ENCOUNTER — Ambulatory Visit (INDEPENDENT_AMBULATORY_CARE_PROVIDER_SITE_OTHER): Payer: Medicare Other | Admitting: Orthopaedic Surgery

## 2022-05-22 ENCOUNTER — Other Ambulatory Visit (HOSPITAL_BASED_OUTPATIENT_CLINIC_OR_DEPARTMENT_OTHER): Payer: Self-pay

## 2022-05-22 DIAGNOSIS — M5416 Radiculopathy, lumbar region: Secondary | ICD-10-CM

## 2022-05-22 DIAGNOSIS — M25551 Pain in right hip: Secondary | ICD-10-CM | POA: Diagnosis not present

## 2022-05-22 MED ORDER — LORAZEPAM 1 MG PO TABS
1.0000 mg | ORAL_TABLET | Freq: Three times a day (TID) | ORAL | 0 refills | Status: DC
  Filled 2022-05-22: qty 2, 1d supply, fill #0

## 2022-05-22 NOTE — Progress Notes (Signed)
Chief Complaint: Right hip pain     History of Present Illness:   05/22/2022: Presents today for follow-up of the right leg.  She states that she got only a couple days of relief from her femoral acetabular injection.  This time she is having pain that really involves the entirety of the leg.  Of note she is status post thrombectomy on the right leg which did improve her pain significantly in the past.  At this time she is endorsing a deep aching pain that radiates and does involve both the hip and knee and the lower leg.  GLENNICE MARCOS is a 76 y.o. female presents today with right hip pain which has been ongoing and consistent for multiple years.  She does have a history of wrist arthritis that ultimately required pinning on September 12 and as result within a boot following this.  She has subsequently had some flareups of her Knee and back/hip.  She states that the pain can occasionally be very bad and limit her ability to walk or be more active.  She is here today for further assessment.  She has not previously had any injections or physical therapy    Surgical History:   None  PMH/PSH/Family History/Social History/Meds/Allergies:    Past Medical History:  Diagnosis Date   Anxiety    Chronic back pain    GERD (gastroesophageal reflux disease)    Hematuria    Hypercholesteremia    Hypertension    Mitral valve prolapse    Osteoarthritis    right great toe   Osteopenia    Vitamin D deficiency    Past Surgical History:  Procedure Laterality Date   ABDOMINAL AORTOGRAM W/LOWER EXTREMITY Bilateral 09/25/2019   Procedure: ABDOMINAL AORTOGRAM W/LOWER EXTREMITY;  Surgeon: Runell Gess, MD;  Location: [facility];  Service: Cardiovascular;  Laterality: Bilateral;   APPENDECTOMY     COLONOSCOPY     PERIPHERAL VASCULAR ATHERECTOMY Right 09/25/2019   Procedure: PERIPHERAL VASCULAR ATHERECTOMY;  Surgeon: Runell Gess, MD;  Location: [facility]  INVASIVE CV LAB;  Service: Cardiovascular;  Laterality: Right;  SFA with DCB   TUBAL LIGATION     Social History   Socioeconomic History   Marital status: Widowed    Spouse name: Not on file   Number of children: 2   Years of education: Not on file   Highest education level: Not on file  Occupational History   Occupation: retired    Comment: accounts payable  Tobacco Use   Smoking status: Never   Smokeless tobacco: Never  Vaping Use   Vaping Use: Never used  Substance and Sexual Activity   Alcohol use: No   Drug use: No   Sexual activity: Not on file  Other Topics Concern   Not on file  Social History Narrative   Not on file   Social Determinants of Health   Financial Resource Strain: Not on file  Food Insecurity: Not on file  Transportation Needs: Not on file  Physical Activity: Not on file  Stress: Not on file  Social Connections: Not on file   Family History  Problem Relation Age of Onset   Diabetes Mother    Heart failure Mother    Other Father        killled in an accident  Heart disease Sister    Stroke Maternal Grandmother    Colon cancer Neg Hx    Breast cancer Neg Hx    Stomach cancer Neg Hx    Liver cancer Neg Hx    Allergies  Allergen Reactions   Ambien [Zolpidem Tartrate]    Atorvastatin     Other reaction(s): elevated liver tests   Denosumab     Other reaction(s): consipation   Elemental Sulfur     RASH ON ABDOMEN   Zolpidem     Other reaction(s): too strong   Sulfamethoxazole Rash   Current Outpatient Medications  Medication Sig Dispense Refill   LORazepam (ATIVAN) 1 MG tablet Take 1 tablet (1 mg total) by mouth every 8 (eight) hours. 2 tablet 0   Acetaminophen (TYLENOL ARTHRITIS PAIN PO)      ALPRAZolam (XANAX) 0.5 MG tablet Take 0.25 mg by mouth 3 (three) times daily as needed for anxiety.   0   amLODipine (NORVASC) 2.5 MG tablet Take 2.5 mg by mouth at bedtime.      ASPIRIN LOW DOSE 81 MG EC tablet TAKE 1 TABLET BY MOUTH EVERY DAY  30 tablet 5   atenolol (TENORMIN) 100 MG tablet Take 100 mg by mouth at bedtime.      cholecalciferol (VITAMIN D) 1000 UNITS tablet Take 1,000 Units by mouth daily.     clobetasol ointment (TEMOVATE) 0.05 % SMARTSIG:Sparingly Topical Daily PRN     Coenzyme Q10 (CO Q-10) 200 MG CAPS See admin instructions.     COVID-19 mRNA bivalent vaccine, Pfizer, (PFIZER COVID-19 VAC BIVALENT) injection Inject into the muscle. 0.3 mL 0   COVID-19 mRNA vaccine 2023-2024 (COMIRNATY) syringe Inject into the muscle. 0.3 mL 0   diclofenac Sodium (VOLTAREN) 1 % GEL as needed.     omeprazole (PRILOSEC) 40 MG capsule Take 1 capsule by mouth as needed.     Propylene Glycol (SYSTANE COMPLETE) 0.6 % SOLN Place 1 drop into both eyes 3 (three) times daily as needed (dry/irritated eyes.).     rosuvastatin (CRESTOR) 10 MG tablet Take 10 mg by mouth daily.     RSV vaccine recomb adjuvanted (AREXVY) 120 MCG/0.5ML injection Inject into the muscle. 0.5 mL 0   valsartan (DIOVAN) 320 MG tablet 1 tablet     No current facility-administered medications for this visit.   No results found.  Review of Systems:   A ROS was performed including pertinent positives and negatives as documented in the HPI.  Physical Exam :   Constitutional: NAD and appears stated age Neurological: Alert and oriented Psych: Appropriate affect and cooperative There were no vitals taken for this visit.   Comprehensive Musculoskeletal Exam:    Tenderness to palpation about the groin and back with 100 degrees of flexion and 20 degrees of internal rotation.  There is approximately 20 degrees of external rotation.  She is unable to go to the figure-of-four position compared to the contralateral side.  Imaging:   Xray (4 views right hip): Early osteoarthritic findings involving the right femoral acetabular joint  I personally reviewed and interpreted the radiographs.   Assessment:   76 y.o. female right lower leg pain in the setting of a possible  lumbar radiculitis type pain.  I did discuss my differential would include this versus claudication type pain.  Having said she has previously had a thrombectomy and is followed routinely for this.  At this time I would like to proceed with a lumbar MRI so that we can rule out  any type of lumbar radiculopathy events leading to her recurrent entire right lower extremity pain  Plan :    -Return to clinic following MRI lumbar spine       I personally saw and evaluated the patient, and participated in the management and treatment plan.  Huel Cote, MD Attending Physician, Orthopedic Surgery  This document was dictated using Dragon voice recognition software. A reasonable attempt at proof reading has been made to minimize errors.

## 2022-06-05 ENCOUNTER — Ambulatory Visit: Payer: Medicare Other | Attending: Physician Assistant | Admitting: Physician Assistant

## 2022-06-05 ENCOUNTER — Encounter: Payer: Self-pay | Admitting: Physician Assistant

## 2022-06-05 VITALS — BP 138/62 | HR 63 | Ht 61.0 in | Wt 116.4 lb

## 2022-06-05 DIAGNOSIS — R002 Palpitations: Secondary | ICD-10-CM

## 2022-06-05 DIAGNOSIS — I10 Essential (primary) hypertension: Secondary | ICD-10-CM

## 2022-06-05 DIAGNOSIS — I739 Peripheral vascular disease, unspecified: Secondary | ICD-10-CM | POA: Diagnosis not present

## 2022-06-05 NOTE — Progress Notes (Unsigned)
Cardiology Office Note:    Date:  06/05/2022   ID:  Diane Villa, DOB Aug 24, 1945, MRN [id number]  PCP:  Laurann Montana, MD   [facility] Providers Cardiologist:  Nanetta Batty, MD { Click to update primary MD,subspecialty MD or APP then REFRESH:1}    Referring MD: Laurann Montana, MD   No chief complaint on file. ***  History of Present Illness:    Diane Villa is a 76 y.o. female with a hx of hypertension, hyperlipidemia and history of PAD.  Patient was initially referred to Dr. Gery Pray by Dr. Dellia Nims her podiatrist for right calf lifestyle-limiting claudication.  Doppler obtained in March 2021 revealed a right ABI of 0.55, left ABI of 1.03.  She underwent peripheral angioplasty on 09/25/2019 which revealed subtotally occluded short segment of right mid SFA with 1 vessel runoff bilaterally via peroneal artery.  She underwent Hawk 1 directional atherectomy followed by drug-coated balloon angioplasty.  Her claudication has since resolved.  Her calcium score in May 2021 was 5.  Right ABI in November dropped from 1.04 down to 0.88.  However repeat ABI 40-month later showed right ABI improved to 0.98, although she does have high frequency signal in her distal SFA.  She was last seen by Dr. Gery Pray in June 2023, she denied any claudication at the time and was very active playing tennis up to 1.5 hours at a time.  She did have frequent palpitation which has gotten worse after switching from atenolol to metoprolol.  She was instructed to go back to atenolol.  Heart monitor obtained in June 2023 showed minimal heart rate of 45, maximal heart rate 162, average heart rate 56.  PVC burden 1.8%.  Patient presents today for follow-up.  Although she does have daily episodes of PVCs, however now much better controlled on atenolol and not bothering her.  She denies any recent chest pain or shortness of breath.  She is developing sciatica an issue and is waiting MRI of the lumbar spine.  She says she  has pain down her right leg, however this is different from her previous claudication symptoms says she has pain all the time.  On physical exam, right lower extremity has good dorsalis pedis pulse but weak posterior tibial pulse.  Her right leg pain is not affected by ambulation and exercise.  It is worse when she is laying on the right side.  The symptom is more consistent with neurogenic rather than vascular pain.  I recommended ABI and LEA as previously scheduled in June 2023 prior to follow-up with Dr. Gery Pray.  Past Medical History:  Diagnosis Date   Anxiety    Chronic back pain    GERD (gastroesophageal reflux disease)    Hematuria    Hypercholesteremia    Hypertension    Mitral valve prolapse    Osteoarthritis    right great toe   Osteopenia    Vitamin D deficiency     Past Surgical History:  Procedure Laterality Date   ABDOMINAL AORTOGRAM W/LOWER EXTREMITY Bilateral 09/25/2019   Procedure: ABDOMINAL AORTOGRAM W/LOWER EXTREMITY;  Surgeon: Runell Gess, MD;  Location: [facility];  Service: Cardiovascular;  Laterality: Bilateral;   APPENDECTOMY     COLONOSCOPY     PERIPHERAL VASCULAR ATHERECTOMY Right 09/25/2019   Procedure: PERIPHERAL VASCULAR ATHERECTOMY;  Surgeon: Runell Gess, MD;  Location: [facility] INVASIVE CV LAB;  Service: Cardiovascular;  Laterality: Right;  SFA with DCB   TUBAL LIGATION  Current Medications: Current Meds  Medication Sig   Acetaminophen (TYLENOL ARTHRITIS PAIN PO)    ALPRAZolam (XANAX) 0.5 MG tablet Take 0.25 mg by mouth 3 (three) times daily as needed for anxiety.    amLODipine (NORVASC) 2.5 MG tablet Take 2.5 mg by mouth at bedtime.    ASPIRIN LOW DOSE 81 MG EC tablet TAKE 1 TABLET BY MOUTH EVERY DAY   atenolol (TENORMIN) 100 MG tablet Take 100 mg by mouth at bedtime.    cholecalciferol (VITAMIN D) 1000 UNITS tablet Take 1,000 Units by mouth daily.   clobetasol ointment (TEMOVATE) 0.05 % SMARTSIG:Sparingly Topical Daily PRN    Coenzyme Q10 (CO Q-10) 200 MG CAPS See admin instructions.   COVID-19 mRNA bivalent vaccine, Pfizer, (PFIZER COVID-19 VAC BIVALENT) injection Inject into the muscle.   COVID-19 mRNA vaccine 2023-2024 (COMIRNATY) syringe Inject into the muscle.   Propylene Glycol (SYSTANE COMPLETE) 0.6 % SOLN Place 1 drop into both eyes 3 (three) times daily as needed (dry/irritated eyes.).   rosuvastatin (CRESTOR) 10 MG tablet Take 10 mg by mouth daily.   RSV vaccine recomb adjuvanted (AREXVY) 120 MCG/0.5ML injection Inject into the muscle.   valsartan (DIOVAN) 320 MG tablet 1 tablet     Allergies:   Ambien [zolpidem tartrate], Atorvastatin, Denosumab, Elemental sulfur, Zolpidem, and Sulfamethoxazole   Social History   Socioeconomic History   Marital status: Widowed    Spouse name: Not on file   Number of children: 2   Years of education: Not on file   Highest education level: Not on file  Occupational History   Occupation: retired    Comment: accounts payable  Tobacco Use   Smoking status: Never   Smokeless tobacco: Never  Vaping Use   Vaping Use: Never used  Substance and Sexual Activity   Alcohol use: No   Drug use: No   Sexual activity: Not on file  Other Topics Concern   Not on file  Social History Narrative   Not on file   Social Determinants of Health   Financial Resource Strain: Not on file  Food Insecurity: Not on file  Transportation Needs: Not on file  Physical Activity: Not on file  Stress: Not on file  Social Connections: Not on file     Family History: The patient's ***family history includes Diabetes in her mother; Heart disease in her sister; Heart failure in her mother; Other in her father; Stroke in her maternal grandmother. There is no history of Colon cancer, Breast cancer, Stomach cancer, or Liver cancer.  ROS:   Please see the history of present illness.    *** All other systems reviewed and are negative.  EKGs/Labs/Other Studies Reviewed:    The following  studies were reviewed today: ***  EKG:  EKG is *** ordered today.  The ekg ordered today demonstrates ***  Recent Labs: No results found for requested labs within last 365 days.  Recent Lipid Panel    Component Value Date/Time   CHOL 116 07/24/2020 0834   TRIG 76 07/24/2020 0834   HDL 58 07/24/2020 0834   CHOLHDL 2.0 07/24/2020 0834   LDLCALC 43 07/24/2020 0834     Risk Assessment/Calculations:   {Does this patient have ATRIAL FIBRILLATION?[phone]}       Physical Exam:    VS:  BP 138/62 (BP Location: Left Arm, Patient Position: Sitting, Cuff Size: Normal)   Pulse 63   Ht 5\' 1"  (1.549 m)   Wt 116 lb 6.4 oz (52.8 kg)   SpO2  98%   BMI 21.99 kg/m         Wt Readings from Last 3 Encounters:  06/05/22 116 lb 6.4 oz (52.8 kg)  11/12/21 109 lb 8 oz (49.7 kg)  05/14/21 107 lb 9.6 oz (48.8 kg)     GEN: *** Well nourished, well developed in no acute distress HEENT: Normal NECK: No JVD; No carotid bruits LYMPHATICS: No lymphadenopathy CARDIAC: ***RRR, no murmurs, rubs, gallops RESPIRATORY:  Clear to auscultation without rales, wheezing or rhonchi  ABDOMEN: Soft, non-tender, non-distended MUSCULOSKELETAL:  No edema; No deformity  SKIN: Warm and dry NEUROLOGIC:  Alert and oriented x 3 PSYCHIATRIC:  Normal affect   ASSESSMENT:    No diagnosis found. PLAN:    In order of problems listed above:  ***      {Are you ordering a CV Procedure (e.g. stress test, cath, DCCV, TEE, etc)?   Press F2        :[id number]    Medication Adjustments/Labs and Tests Ordered: Current medicines are reviewed at length with the patient today.  Concerns regarding medicines are outlined above.  No orders of the defined types were placed in this encounter.  No orders of the defined types were placed in this encounter.   There are no Patient Instructions on file for this visit.   Hilbert Corrigan, [place]  06/05/2022 1:40 PM    [facility]

## 2022-06-05 NOTE — Patient Instructions (Signed)
Medication Instructions:   Your physician recommends that you continue on your current medications as directed. Please refer to the Current Medication list given to you today.  *If you need a refill on your cardiac medications before your next appointment, please call your pharmacy*  Lab Work: NONE ordered at this time of appointment   If you have labs (blood work) drawn today and your tests are completely normal, you will receive your results only by: MyChart Message (if you have MyChart) OR A paper copy in the mail If you have any lab test that is abnormal or we need to change your treatment, we will call you to review the results.  Testing/Procedures: NONE ordered at this time of appointment   Follow-Up: At [facility], you and your health needs are our priority.  As part of our continuing mission to provide you with exceptional heart care, we have created designated Provider Care Teams.  These Care Teams include your primary Cardiologist (physician) and Advanced Practice Providers (APPs -  Physician Assistants and Nurse Practitioners) who all work together to provide you with the care you need, when you need it.  Your next appointment:   6 month(s) after ABI and LEA scans   The format for your next appointment:   In Person  Provider:   Nanetta Batty, MD     Other Instructions MONITOR blood pressure at home for 2 weeks. If your top number is consistently greater than 130 INCREASE Amlodipine to 5 mg daily. Give our office a call if you have to increase medication so new prescription can be called into your pharmacy. If your blood pressures at home are good you will not need to increase medication.   Important Information About Sugar

## 2022-06-07 ENCOUNTER — Encounter: Payer: Self-pay | Admitting: Physician Assistant

## 2022-06-13 ENCOUNTER — Ambulatory Visit
Admission: RE | Admit: 2022-06-13 | Discharge: 2022-06-13 | Disposition: A | Discharge status: Home / Self Care | Payer: Medicare Other | Source: Ambulatory Visit | Attending: Orthopaedic Surgery | Admitting: Orthopaedic Surgery

## 2022-06-13 DIAGNOSIS — M5416 Radiculopathy, lumbar region: Secondary | ICD-10-CM

## 2022-06-13 DIAGNOSIS — M545 Low back pain, unspecified: Secondary | ICD-10-CM | POA: Diagnosis not present

## 2022-06-19 ENCOUNTER — Ambulatory Visit (INDEPENDENT_AMBULATORY_CARE_PROVIDER_SITE_OTHER): Payer: Medicare Other | Admitting: Orthopaedic Surgery

## 2022-06-19 DIAGNOSIS — M19079 Primary osteoarthritis, unspecified ankle and foot: Secondary | ICD-10-CM

## 2022-06-19 NOTE — Progress Notes (Signed)
Chief Complaint: Right hip pain     History of Present Illness:   06/19/2022: Diane Villa presents today for follow-up of her right leg pain.  Overall her workup has been relatively of no avail.  She states that she has been walking abnormally ever since her right great toe surgery 4 months prior.  She is hoping to talk more about this today.  Diane Villa is a 77 y.o. female presents today with right hip pain which has been ongoing and consistent for multiple years.  She does have a history of wrist arthritis that ultimately required pinning on September 12 and as result within a boot following this.  She has subsequently had some flareups of her Knee and back/hip.  She states that the pain can occasionally be very bad and limit her ability to walk or be more active.  She is here today for further assessment.  She has not previously had any injections or physical therapy    Surgical History:   None  PMH/PSH/Family History/Social History/Meds/Allergies:    Past Medical History:  Diagnosis Date   Anxiety    Chronic back pain    GERD (gastroesophageal reflux disease)    Hematuria    Hypercholesteremia    Hypertension    Mitral valve prolapse    Osteoarthritis    right great toe   Osteopenia    Vitamin D deficiency    Past Surgical History:  Procedure Laterality Date   ABDOMINAL AORTOGRAM W/LOWER EXTREMITY Bilateral 09/25/2019   Procedure: ABDOMINAL AORTOGRAM W/LOWER EXTREMITY;  Surgeon: Lorretta Harp, MD;  Location: [place] [facility];  Service: Cardiovascular;  Laterality: Bilateral;   APPENDECTOMY     COLONOSCOPY     PERIPHERAL VASCULAR ATHERECTOMY Right 09/25/2019   Procedure: PERIPHERAL VASCULAR ATHERECTOMY;  Surgeon: Lorretta Harp, MD;  Location: [place] [facility];  Service: Cardiovascular;  Laterality: Right;  SFA with DCB   TUBAL LIGATION     Social History   Socioeconomic History   Marital status: Widowed    Spouse name: Not on  file   Number of children: 2   Years of education: Not on file   Highest education level: Not on file  Occupational History   Occupation: retired    Comment: accounts payable  Tobacco Use   Smoking status: Never   Smokeless tobacco: Never  Vaping Use   Vaping Use: Never used  Substance and Sexual Activity   Alcohol use: No   Drug use: No   Sexual activity: Not on file  Other Topics Concern   Not on file  Social History Narrative   Not on file   Social Determinants of Health   Financial Resource Strain: Not on file  Food Insecurity: Not on file  Transportation Needs: Not on file  Physical Activity: Not on file  Stress: Not on file  Social Connections: Not on file   Family History  Problem Relation Age of Onset   Diabetes Mother    Heart failure Mother    Other Father        killled in an accident   Heart disease Sister    Stroke Maternal Grandmother    Colon cancer Neg Hx    Breast cancer Neg Hx    Stomach cancer Neg Hx    Liver cancer Neg  Hx    Allergies  Allergen Reactions   Ambien [Zolpidem Tartrate]    Atorvastatin     Other reaction(s): elevated liver tests   Denosumab     Other reaction(s): consipation   Elemental Sulfur     RASH ON ABDOMEN   Zolpidem     Other reaction(s): too strong   Sulfamethoxazole Rash   Current Outpatient Medications  Medication Sig Dispense Refill   Acetaminophen (TYLENOL ARTHRITIS PAIN PO)      ALPRAZolam (XANAX) 0.5 MG tablet Take 0.25 mg by mouth 3 (three) times daily as needed for anxiety.   0   amLODipine (NORVASC) 2.5 MG tablet Take 2.5 mg by mouth at bedtime.      ASPIRIN LOW DOSE 81 MG EC tablet TAKE 1 TABLET BY MOUTH EVERY DAY 30 tablet 5   atenolol (TENORMIN) 100 MG tablet Take 100 mg by mouth at bedtime.      cholecalciferol (VITAMIN D) 1000 UNITS tablet Take 1,000 Units by mouth daily.     clobetasol ointment (TEMOVATE) 0.05 % SMARTSIG:Sparingly Topical Daily PRN     Coenzyme Q10 (CO Q-10) 200 MG CAPS See admin  instructions.     COVID-19 mRNA bivalent vaccine, Pfizer, (PFIZER COVID-19 VAC BIVALENT) injection Inject into the muscle. 0.3 mL 0   COVID-19 mRNA vaccine 2023-2024 (COMIRNATY) syringe Inject into the muscle. 0.3 mL 0   diclofenac Sodium (VOLTAREN) 1 % GEL as needed. (Patient not taking: Reported on 06/05/2022)     LORazepam (ATIVAN) 1 MG tablet Take 1 tablet (1 mg total) by mouth every 8 (eight) hours. (Patient not taking: Reported on 06/05/2022) 2 tablet 0   omeprazole (PRILOSEC) 40 MG capsule Take 1 capsule by mouth as needed. (Patient not taking: Reported on 06/05/2022)     Propylene Glycol (SYSTANE COMPLETE) 0.6 % SOLN Place 1 drop into both eyes 3 (three) times daily as needed (dry/irritated eyes.).     rosuvastatin (CRESTOR) 10 MG tablet Take 10 mg by mouth daily.     RSV vaccine recomb adjuvanted (AREXVY) 120 MCG/0.5ML injection Inject into the muscle. 0.5 mL 0   valsartan (DIOVAN) 320 MG tablet 1 tablet     No current facility-administered medications for this visit.   No results found.  Review of Systems:   A ROS was performed including pertinent positives and negatives as documented in the HPI.  Physical Exam :   Constitutional: NAD and appears stated age Neurological: Alert and oriented Psych: Appropriate affect and cooperative There were no vitals taken for this visit.   Comprehensive Musculoskeletal Exam:    Tenderness to palpation about the groin and back with 100 degrees of flexion and 20 degrees of internal rotation.  There is approximately 20 degrees of external rotation.  She is unable to go to the figure-of-four position compared to the contralateral side.  Significant hallux rigidus with tenderness and pain over the first MTP  Imaging:   Xray (4 views right hip, right foot 3 views): Early osteoarthritic findings involving the right femoral acetabular joint.  Status post pinning of the right first distal phalanx with severe MTP arthritis  I personally reviewed  and interpreted the radiographs.   Assessment:   77 y.o. female right lower leg pain which has been relatively difficult to diagnose.  On further discussion with her today, she did have a right first distal metatarsal osteotomy performed and she states that all of these issues have cleared up since that.  She does note that she is walking  on the outside of the foot.  On review of her radiographs today I do believe that she has quite severe first MTP osteoarthritis and likely this is changing her gait pattern and causing more proximal base pain.  To that effect I do believe that she would benefit from a referral to Dr. Sharol Given for discussion of first MTP fusion.  Will plan for referral to him today Plan :    -Plan for referral to Dr. Sharol Given for consideration of right first MTP fusion       I personally saw and evaluated the patient, and participated in the management and treatment plan.  Vanetta Mulders, MD Attending Physician, Orthopedic Surgery  This document was dictated using Dragon voice recognition software. A reasonable attempt at proof reading has been made to minimize errors.

## 2022-06-24 ENCOUNTER — Ambulatory Visit: Payer: Medicare Other | Admitting: Podiatry

## 2022-06-24 ENCOUNTER — Encounter: Payer: Self-pay | Admitting: Podiatry

## 2022-06-24 ENCOUNTER — Ambulatory Visit (INDEPENDENT_AMBULATORY_CARE_PROVIDER_SITE_OTHER): Payer: Medicare Other

## 2022-06-24 DIAGNOSIS — M7751 Other enthesopathy of right foot: Secondary | ICD-10-CM | POA: Diagnosis not present

## 2022-06-24 DIAGNOSIS — T81509A Unspecified complication of foreign body accidentally left in body following unspecified procedure, initial encounter: Secondary | ICD-10-CM

## 2022-06-24 MED ORDER — MELOXICAM 15 MG PO TABS: 15.0000 mg | ORAL_TABLET | Freq: Every day | ORAL | 2 refills | Status: DC

## 2022-06-24 MED ORDER — TRIAMCINOLONE ACETONIDE 10 MG/ML IJ SUSP
10.0000 mg | Freq: Once | INTRAMUSCULAR | Status: AC
  Administered 2022-06-24: 10 mg

## 2022-06-24 NOTE — Progress Notes (Signed)
Subjective:   Patient ID: Diane Villa, female   DOB: 77 y.o.   MRN: [id number]   HPI Patient states that she has some pain in the ball of the foot and she feels like it could be connected to something in her back with patient having some back issues also.  States it just comes at different times the pain is not like it was prior to surgery and she does not have the constant pain she used to but occasionally gets shooting like discomfort   ROS      Objective:  Physical Exam  Neuro vascular status intact with inflammation pain in the subfirst MPJ capsule mostly on the medial side localized range of motion is adequate there is no crepitus in the joint there is no pain when I move the toe back-and-forth currently with moderate limitation of motion     Assessment:  Patient who had biplanar osteotomy right who does have limited motion but adequate with no crepitus of the joint noted and does have discomfort in the subplantar area mild prominence in this area      Plan:  H&P reviewed did a careful injection medial capsule 2 mg dexamethasone Kenalog 5 mg Xylocaine applied dancers pad to offload weight off the first metatarsal head and placed on Mobic.  Reappoint to recheck 4 weeks may have to consider also pin removal in future  X-rays indicate there is a good healing good position of the first metatarsal unfortunately there is arthritis in the joint but I do still think clinically this could work well for her

## 2022-07-09 ENCOUNTER — Ambulatory Visit (INDEPENDENT_AMBULATORY_CARE_PROVIDER_SITE_OTHER): Payer: Medicare Other | Admitting: Orthopedic Surgery

## 2022-07-09 DIAGNOSIS — M2021 Hallux rigidus, right foot: Secondary | ICD-10-CM | POA: Diagnosis not present

## 2022-07-09 DIAGNOSIS — M25561 Pain in right knee: Secondary | ICD-10-CM | POA: Diagnosis not present

## 2022-07-09 DIAGNOSIS — M5416 Radiculopathy, lumbar region: Secondary | ICD-10-CM

## 2022-07-09 DIAGNOSIS — G8929 Other chronic pain: Secondary | ICD-10-CM

## 2022-07-10 ENCOUNTER — Encounter: Payer: Self-pay | Admitting: Orthopedic Surgery

## 2022-07-10 DIAGNOSIS — M25561 Pain in right knee: Secondary | ICD-10-CM

## 2022-07-10 DIAGNOSIS — G8929 Other chronic pain: Secondary | ICD-10-CM | POA: Diagnosis not present

## 2022-07-10 MED ORDER — METHYLPREDNISOLONE ACETATE 40 MG/ML IJ SUSP
40.0000 mg | INTRAMUSCULAR | Status: AC | PRN
  Administered 2022-07-10: 40 mg via INTRA_ARTICULAR

## 2022-07-10 MED ORDER — LIDOCAINE HCL (PF) 1 % IJ SOLN
5.0000 mL | INTRAMUSCULAR | Status: AC | PRN
  Administered 2022-07-10: 5 mL

## 2022-07-10 NOTE — Progress Notes (Signed)
Office Visit Note   Patient: Diane Villa           Date of Birth: 26-May-1946           MRN: [id number] Visit Date: 07/09/2022              Requested by: Vanetta Mulders, MD [address] [place],  [place] [address] PCP: Harlan Stains, MD  Chief Complaint  Patient presents with   Right Foot - Pain      HPI: Patient is a 77 year old woman who presents with 3 separate issues.  She complains of increasing pain in right great toe MTP joint status post surgery with podiatry September 12.  Patient also has chronic right knee pain from osteoarthritis and is status post an MRI scan of her lumbar spine.  Assessment & Plan: Visit Diagnoses:  1. Radiculopathy, lumbar region   2. Chronic pain of right knee   3. Hallux rigidus, right foot     Plan: Patient's right knee was injected she tolerated this well.  No intervention indicated at this time for her lumbar spine.  Recommended quad VMO strengthening for the right knee.  Discussed that with the advanced collapse of the MTP joint of the right great toe her options are a stiff soled shoe to unload the first metatarsal versus a fusion.  Patient will follow-up as needed.  Follow-Up Instructions: No follow-ups on file.   Ortho Exam  Patient is alert, oriented, no adenopathy, well-dressed, normal affect, normal respiratory effort. Examination patient has a dorsal incision right foot great toe MTP joint.  There are 2 K wires in place appears she may have had a chevron type osteotomy.  Does not appear she had a cheilectomy.  She has essentially no range of motion of the MTP joint.  Attempted motion is painful.  Patient has palpable pulses.  Examination of the right knee patient has tenderness to palpation over the medial lateral facets of the patella medial lateral joint lines are minimally tender to palpation collaterals and cruciates are stable.  Patient MRI scan shows moderate to mild bulging disks.  No radicular symptoms at this  time.  Imaging: No results found. No images are attached to the encounter.  Labs: Lab Results  Component Value Date   REPTSTATUS 10/03/2018 FINAL 10/01/2018   CULT >=100,000 COLONIES/mL ESCHERICHIA COLI (A) 10/01/2018   LABORGA ESCHERICHIA COLI (A) 10/01/2018     Lab Results  Component Value Date   ALBUMIN 4.3 11/15/2019    No results found for: "MG" No results found for: "VD25OH"  No results found for: "PREALBUMIN"    Latest Ref Rng & Units 09/26/2019    4:40 AM 09/12/2019    2:39 PM 09/30/2018    8:40 PM  CBC EXTENDED  WBC 4.0 - 10.5 K/uL 6.2  6.2  6.1   RBC 3.87 - 5.11 MIL/uL 4.21  5.63  4.99   Hemoglobin 12.0 - 15.0 g/dL 12.6  16.4  14.9   HCT 36.0 - 46.0 % 38.8  50.8  45.6   Platelets 150 - 400 K/uL 147  203  176      There is no height or weight on file to calculate BMI.  Orders:  No orders of the defined types were placed in this encounter.  No orders of the defined types were placed in this encounter.    Procedures: Large Joint Inj: R knee on 07/10/2022 3:44 PM Indications: pain and diagnostic evaluation Details: 22 G 1.5 in  needle, anteromedial approach  Arthrogram: No  Medications: 5 mL lidocaine (PF) 1 %; 40 mg methylPREDNISolone acetate 40 MG/ML Outcome: tolerated well, no immediate complications Procedure, treatment alternatives, risks and benefits explained, specific risks discussed. Consent was given by the patient. Immediately prior to procedure a time out was called to verify the correct patient, procedure, equipment, support staff and site/side marked as required. Patient was prepped and draped in the usual sterile fashion.      Clinical Data: No additional findings.  ROS:  All other systems negative, except as noted in the HPI. Review of Systems  Objective: Vital Signs: There were no vitals taken for this visit.  Specialty Comments:  No specialty comments available.  PMFS History: Patient Active Problem List   Diagnosis Date  Noted   Palpitations 11/12/2021   Preoperative clearance 11/12/2021   Age-related osteoporosis without current pathological fracture 11/13/2020   Anxiety 11/13/2020   Chronic insomnia 11/13/2020   Gastroesophageal reflux disease 11/13/2020   Irritable bowel syndrome 11/13/2020   Microscopic hematuria 11/13/2020   Sacroiliitis, not elsewhere classified ([facility]) 11/13/2020   Vitamin D deficiency 11/13/2020   Claudication in peripheral vascular disease ([facility] [facility]) 09/25/2019   Peripheral arterial disease ([facility]) 09/12/2019   Essential hypertension 09/12/2019   Hyperlipidemia 09/12/2019   Past Medical History:  Diagnosis Date   Anxiety    Chronic back pain    GERD (gastroesophageal reflux disease)    Hematuria    Hypercholesteremia    Hypertension    Mitral valve prolapse    Osteoarthritis    right great toe   Osteopenia    Vitamin D deficiency     Family History  Problem Relation Age of Onset   Diabetes Mother    Heart failure Mother    Other Father        killled in an accident   Heart disease Sister    Stroke Maternal Grandmother    Colon cancer Neg Hx    Breast cancer Neg Hx    Stomach cancer Neg Hx    Liver cancer Neg Hx     Past Surgical History:  Procedure Laterality Date   ABDOMINAL AORTOGRAM W/LOWER EXTREMITY Bilateral 09/25/2019   Procedure: ABDOMINAL AORTOGRAM W/LOWER EXTREMITY;  Surgeon: Lorretta Harp, MD;  Location: [facility];  Service: Cardiovascular;  Laterality: Bilateral;   APPENDECTOMY     COLONOSCOPY     PERIPHERAL VASCULAR ATHERECTOMY Right 09/25/2019   Procedure: PERIPHERAL VASCULAR ATHERECTOMY;  Surgeon: Lorretta Harp, MD;  Location: [facility];  Service: Cardiovascular;  Laterality: Right;  SFA with DCB   TUBAL LIGATION     Social History   Occupational History   Occupation: retired    Comment: accounts payable  Tobacco Use   Smoking status: Never   Smokeless tobacco: Never  Vaping Use   Vaping Use: Never used  Substance  and Sexual Activity   Alcohol use: No   Drug use: No   Sexual activity: Not on file

## 2022-07-22 ENCOUNTER — Ambulatory Visit (INDEPENDENT_AMBULATORY_CARE_PROVIDER_SITE_OTHER): Payer: Medicare Other

## 2022-07-22 ENCOUNTER — Ambulatory Visit: Payer: Medicare Other | Admitting: Podiatry

## 2022-07-22 DIAGNOSIS — M7751 Other enthesopathy of right foot: Secondary | ICD-10-CM

## 2022-07-22 NOTE — Progress Notes (Signed)
Subjective:   Patient ID: Diane Villa, female   DOB: 77 y.o.   MRN: [id number]   HPI Patient states that her foot has improved with the medication we used at last visit but she still can get some occasional pain and also her knee and her hip are really bothering her right side and that could be the genesis of her problem   ROS      Objective:  Physical Exam  Neurovascular status intact patient does have a prominent dorsal placed on the first metatarsal shaft from where pin was put in last year.  Patient states that it is starting to aggravate her and make it hard to wear shoe gear comfortably and range of motion adequate minimal discomfort when moved     Assessment:  Abnormal pin position first metatarsal painful right along with inflammatory condition improving but problems with the knee and the hip     Plan:  H&P all conditions reviewed recommended pin removal discussed procedure risk this can be done to the office and I did explain that the risk and allowed her to read and signed consent form.  I did say that we can do this in the office and I also want her to continue with good support shoes and wide soles.  She has tried padding she tried other modalities without improvement of this area on top of her foot    X-rays indicate there is some arthritis first MPJ which we have known about but not so far painful when moved with prominent pin position first metatarsal shaft right

## 2022-07-27 ENCOUNTER — Encounter: Payer: Self-pay | Admitting: Podiatry

## 2022-07-27 ENCOUNTER — Ambulatory Visit (INDEPENDENT_AMBULATORY_CARE_PROVIDER_SITE_OTHER): Payer: Medicare Other | Admitting: Podiatry

## 2022-07-27 VITALS — BP 181/79 | HR 57

## 2022-07-27 DIAGNOSIS — Z472 Encounter for removal of internal fixation device: Secondary | ICD-10-CM | POA: Diagnosis not present

## 2022-07-27 NOTE — Progress Notes (Signed)
Subjective:   Patient ID: Diane Villa, female   DOB: 77 y.o.   MRN: [id number]   HPI Patient presents stating she is here for pin removal it is getting increasingly more discomforting for her.  States that it is hard to wear shoe gear at times   ROS      Objective:  Physical Exam  Neurovascular status intact with patient found to have a prominent pin position proximal portion first metatarsal shaft right foot     Assessment:  Prominent pin position right first metatarsal irritated discomfort and     Plan:  H&P reviewed and recommended removal and at this time anesthetized the right forefoot with 60 mg Xylocaine Marcaine mixture.  Patient was taken to the operating room and sterile prep was administered and tourniquet then inflated to 250 mmHg.  I exposed the area with prominent pin I made an incision of approximate 1.5 cm took it through subcutaneous tissue with hemostasis being required as necessary further took the capsule made a linear capsular incision and sharply dissected the capsule off the underlying bone.  Identified the prominent pin and removed it in toto flush the wound with copious amounts of sterile Garamycin solution and then sutured with 5-0 nylon.  Sterile dressing was applied with compression tourniquet released capillary fill noted to be immediate to all digits of the right foot and she left the operating room in satisfactory condition and will be seen back in 2 weeks or earlier if needed

## 2022-08-03 ENCOUNTER — Other Ambulatory Visit: Payer: Self-pay | Admitting: Family Medicine

## 2022-08-03 DIAGNOSIS — M810 Age-related osteoporosis without current pathological fracture: Secondary | ICD-10-CM

## 2022-08-07 ENCOUNTER — Ambulatory Visit
Admission: RE | Admit: 2022-08-07 | Discharge: 2022-08-07 | Disposition: A | Discharge status: Home / Self Care | Payer: Medicare Other | Source: Ambulatory Visit | Attending: Family Medicine | Admitting: Family Medicine

## 2022-08-07 DIAGNOSIS — Z780 Asymptomatic menopausal state: Secondary | ICD-10-CM | POA: Diagnosis not present

## 2022-08-07 DIAGNOSIS — M810 Age-related osteoporosis without current pathological fracture: Secondary | ICD-10-CM | POA: Diagnosis not present

## 2022-08-10 ENCOUNTER — Ambulatory Visit (INDEPENDENT_AMBULATORY_CARE_PROVIDER_SITE_OTHER): Payer: Medicare Other | Admitting: Podiatry

## 2022-08-10 VITALS — BP 136/72 | HR 81

## 2022-08-10 DIAGNOSIS — Z9889 Other specified postprocedural states: Secondary | ICD-10-CM

## 2022-08-10 NOTE — Progress Notes (Signed)
Patient presents today for post op visit # 1 , patient of Dr. Paulla Dolly     POV # 1 DOS 07/27/22 --- REMOVAL PIN FROM TOP RIGHT FOOT   Patient presents in supportive walking shoe. Denies any falls or injury to the foot. Foot is slightly swollen. No signs of infection. No calf pain or shortness of breath. Bandages dry and intact. Incision is intact.  Sutures were removed today without complication. Patient tolerated suture removal well.   BP: 136/72 P: 81        Incision covered with a band-aid for comfort and placed back in the supportive walking show. Reviewed icing and elevation. Patient will follow up with Dr. Paulla Dolly as needed.

## 2022-08-11 DIAGNOSIS — M545 Low back pain, unspecified: Secondary | ICD-10-CM | POA: Diagnosis not present

## 2022-08-11 DIAGNOSIS — M810 Age-related osteoporosis without current pathological fracture: Secondary | ICD-10-CM | POA: Diagnosis not present

## 2022-08-11 DIAGNOSIS — M1711 Unilateral primary osteoarthritis, right knee: Secondary | ICD-10-CM | POA: Diagnosis not present

## 2022-08-11 DIAGNOSIS — M25561 Pain in right knee: Secondary | ICD-10-CM | POA: Diagnosis not present

## 2022-08-11 DIAGNOSIS — E785 Hyperlipidemia, unspecified: Secondary | ICD-10-CM | POA: Diagnosis not present

## 2022-08-11 DIAGNOSIS — I10 Essential (primary) hypertension: Secondary | ICD-10-CM | POA: Diagnosis not present

## 2022-08-11 DIAGNOSIS — M25551 Pain in right hip: Secondary | ICD-10-CM | POA: Diagnosis not present

## 2022-08-17 ENCOUNTER — Encounter: Payer: Medicare Other | Admitting: Podiatry

## 2022-09-10 DIAGNOSIS — M79671 Pain in right foot: Secondary | ICD-10-CM | POA: Diagnosis not present

## 2022-09-17 DIAGNOSIS — M79671 Pain in right foot: Secondary | ICD-10-CM | POA: Diagnosis not present

## 2022-09-22 DIAGNOSIS — M79671 Pain in right foot: Secondary | ICD-10-CM | POA: Diagnosis not present

## 2022-09-24 DIAGNOSIS — M79671 Pain in right foot: Secondary | ICD-10-CM | POA: Diagnosis not present

## 2022-09-28 DIAGNOSIS — M79671 Pain in right foot: Secondary | ICD-10-CM | POA: Diagnosis not present

## 2022-10-01 DIAGNOSIS — M79671 Pain in right foot: Secondary | ICD-10-CM | POA: Diagnosis not present

## 2022-10-05 DIAGNOSIS — M79671 Pain in right foot: Secondary | ICD-10-CM | POA: Diagnosis not present

## 2022-10-08 DIAGNOSIS — M25551 Pain in right hip: Secondary | ICD-10-CM | POA: Diagnosis not present

## 2022-10-08 DIAGNOSIS — M1711 Unilateral primary osteoarthritis, right knee: Secondary | ICD-10-CM | POA: Diagnosis not present

## 2022-10-12 DIAGNOSIS — M79671 Pain in right foot: Secondary | ICD-10-CM | POA: Diagnosis not present

## 2022-10-14 DIAGNOSIS — M79671 Pain in right foot: Secondary | ICD-10-CM | POA: Diagnosis not present

## 2022-10-19 DIAGNOSIS — M79671 Pain in right foot: Secondary | ICD-10-CM | POA: Diagnosis not present

## 2022-10-21 DIAGNOSIS — M79671 Pain in right foot: Secondary | ICD-10-CM | POA: Diagnosis not present

## 2022-10-22 DIAGNOSIS — M25551 Pain in right hip: Secondary | ICD-10-CM | POA: Diagnosis not present

## 2022-11-10 ENCOUNTER — Telehealth: Payer: Medicare Other | Admitting: Physician Assistant

## 2022-11-10 ENCOUNTER — Ambulatory Visit (HOSPITAL_COMMUNITY)
Admission: RE | Admit: 2022-11-10 | Discharge: 2022-11-10 | Disposition: A | Discharge status: Home / Self Care | Payer: Medicare Other | Source: Ambulatory Visit | Attending: Cardiovascular Disease | Admitting: Cardiovascular Disease

## 2022-11-10 DIAGNOSIS — R3989 Other symptoms and signs involving the genitourinary system: Secondary | ICD-10-CM

## 2022-11-10 DIAGNOSIS — E782 Mixed hyperlipidemia: Secondary | ICD-10-CM | POA: Diagnosis not present

## 2022-11-10 DIAGNOSIS — I739 Peripheral vascular disease, unspecified: Secondary | ICD-10-CM | POA: Diagnosis not present

## 2022-11-10 DIAGNOSIS — I10 Essential (primary) hypertension: Secondary | ICD-10-CM

## 2022-11-10 MED ORDER — CEPHALEXIN 500 MG PO CAPS: 500.0000 mg | ORAL_CAPSULE | Freq: Two times a day (BID) | ORAL | 0 refills | Status: AC

## 2022-11-10 NOTE — Patient Instructions (Signed)
Lennette Bihari, thank you for joining [name], PA-C for today's virtual visit.  While this provider is not your primary care provider (PCP), if your PCP is located in our provider database this encounter information will be shared with them immediately following your visit.   A [facility] MyChart account gives you access to today's visit and all your visits, tests, and labs performed at [facility] " click here if you don't have a [facility] MyChart account or go to [email or web address]  Consent: (Patient) Lennette Bihari provided verbal consent for this virtual visit at the beginning of the encounter.  Current Medications:  Current Outpatient Medications:    Acetaminophen (TYLENOL ARTHRITIS PAIN PO), , Disp: , Rfl:    ALPRAZolam (XANAX) 0.5 MG tablet, Take 0.25 mg by mouth 3 (three) times daily as needed for anxiety. , Disp: , Rfl: 0   amLODipine (NORVASC) 2.5 MG tablet, Take 2.5 mg by mouth at bedtime. , Disp: , Rfl:    ASPIRIN LOW DOSE 81 MG EC tablet, TAKE 1 TABLET BY MOUTH EVERY DAY, Disp: 30 tablet, Rfl: 5   atenolol (TENORMIN) 100 MG tablet, Take 100 mg by mouth at bedtime. , Disp: , Rfl:    cholecalciferol (VITAMIN D) 1000 UNITS tablet, Take 1,000 Units by mouth daily., Disp: , Rfl:    clobetasol ointment (TEMOVATE) 0.05 %, SMARTSIG:Sparingly Topical Daily PRN, Disp: , Rfl:    Coenzyme Q10 (CO Q-10) 200 MG CAPS, See admin instructions., Disp: , Rfl:    COVID-19 mRNA bivalent vaccine, Pfizer, (PFIZER COVID-19 VAC BIVALENT) injection, Inject into the muscle., Disp: 0.3 mL, Rfl: 0   COVID-19 mRNA vaccine 2023-2024 (COMIRNATY) syringe, Inject into the muscle., Disp: 0.3 mL, Rfl: 0   meloxicam (MOBIC) 15 MG tablet, Take 1 tablet (15 mg total) by mouth daily., Disp: 30 tablet, Rfl: 2   Propylene Glycol (SYSTANE COMPLETE) 0.6 % SOLN, Place 1 drop into both eyes 3 (three) times daily as needed (dry/irritated eyes.)., Disp: , Rfl:    rosuvastatin (CRESTOR) 10 MG  tablet, Take 10 mg by mouth daily., Disp: , Rfl:    RSV vaccine recomb adjuvanted (AREXVY) 120 MCG/0.5ML injection, Inject into the muscle., Disp: 0.5 mL, Rfl: 0   valsartan (DIOVAN) 320 MG tablet, 1 tablet, Disp: , Rfl:    Medications ordered in this encounter:  No orders of the defined types were placed in this encounter.    *If you need refills on other medications prior to your next appointment, please contact your pharmacy*  Follow-Up: Call back or seek an in-person evaluation if the symptoms worsen or if the condition fails to improve as anticipated.   Virtual Care [phone]  Other Instructions Your symptoms are consistent with a bladder infection, also called acute cystitis. Please take your antibiotic (Keflex) as directed until all pills are gone.  Stay very well hydrated.  Consider a daily probiotic (Align, Culturelle, or Activia) to help prevent stomach upset caused by the antibiotic.  Taking a probiotic daily may also help prevent recurrent UTIs.  Also consider taking AZO (Phenazopyridine) tablets to help decrease pain with urination.    Urinary Tract Infection A urinary tract infection (UTI) can occur any place along the urinary tract. The tract includes the kidneys, ureters, bladder, and urethra. A type of germ called bacteria often causes a UTI. UTIs are often helped with antibiotic medicine.  HOME CARE  If given, take antibiotics as told by your doctor. Finish them even if you start  to feel better. Drink enough fluids to keep your pee (urine) clear or pale yellow. Avoid tea, drinks with caffeine, and bubbly (carbonated) drinks. Pee often. Avoid holding your pee in for a long time. Pee before and after having sex (intercourse). Wipe from front to back after you poop (bowel movement) if you are a woman. Use each tissue only once. GET HELP RIGHT AWAY IF:  You have back pain. You have lower belly (abdominal) pain. You have chills. You feel sick to your  stomach (nauseous). You throw up (vomit). Your burning or discomfort with peeing does not go away. You have a fever. Your symptoms are not better in 3 days. MAKE SURE YOU:  Understand these instructions. Will watch your condition. Will get help right away if you are not doing well or get worse. Document Released: 11/11/2007 Document Revised: 02/17/2012 Document Reviewed: 12/24/2011 [facility] Patient Information 2015 [place], [place]. This information is not intended to replace advice given to you by your health care provider. Make sure you discuss any questions you have with your health care provider.    If you have been instructed to have an in-person evaluation today at a local Urgent Care facility, please use the link below. It will take you to a list of all of our available [facility], including address, phone number and hours of operation. Please do not delay care.  [facility]  If you or a family member do not have a primary care provider, use the link below to schedule a visit and establish care. When you choose a [facility] primary care physician or advanced practice provider, you gain a long-term partner in health. Find a Primary Care Provider  Learn more about [facility] in-office and virtual care options: [facility] - Get Care Now

## 2022-11-10 NOTE — Progress Notes (Signed)
Virtual Visit Consent   Diane Villa, you are scheduled for a virtual visit with a [facility] provider today. Just as with appointments in the office, your consent must be obtained to participate. Your consent will be active for this visit and any virtual visit you may have with one of our providers in the next 365 days. If you have a MyChart account, a copy of this consent can be sent to you electronically.  As this is a virtual visit, video technology does not allow for your provider to perform a traditional examination. This may limit your provider's ability to fully assess your condition. If your provider identifies any concerns that need to be evaluated in person or the need to arrange testing (such as labs, EKG, etc.), we will make arrangements to do so. Although advances in technology are sophisticated, we cannot ensure that it will always work on either your end or our end. If the connection with a video visit is poor, the visit may have to be switched to a telephone visit. With either a video or telephone visit, we are not always able to ensure that we have a secure connection.  By engaging in this virtual visit, you consent to the provision of healthcare and authorize for your insurance to be billed (if applicable) for the services provided during this visit. Depending on your insurance coverage, you may receive a charge related to this service.  I need to obtain your verbal consent now. Are you willing to proceed with your visit today? Diane Villa has provided verbal consent on 11/10/2022 for a virtual visit (video or telephone). Diane Villa, [place]  Date: 11/10/2022 5:17 PM  Virtual Visit via Video Note   I, Diane Villa, connected with  [name]  ([id number], 1945/09/20) on 11/10/22 at  5:30 PM EDT by a video-enabled telemedicine application and verified that I am speaking with the correct person using two identifiers.  Location: Patient: Virtual Visit Location  Patient: Home Provider: Virtual Visit Location Provider: Home Office   I discussed the limitations of evaluation and management by telemedicine and the availability of in person appointments. The patient expressed understanding and agreed to proceed.    History of Present Illness: Diane Villa is a 77 y.o. who identifies as a female who was assigned female at birth, and is being seen today for possible UTI. Patient endorses urinary urgency, frequency and mild hematuria. Denies fever, chills, nausea or vomiting. Symptoms over the past 2 days.  Prior history of UTI with these symptoms.   HPI: HPI  Problems:  Patient Active Problem List   Diagnosis Date Noted   Palpitations 11/12/2021   Preoperative clearance 11/12/2021   Age-related osteoporosis without current pathological fracture 11/13/2020   Anxiety 11/13/2020   Chronic insomnia 11/13/2020   Gastroesophageal reflux disease 11/13/2020   Irritable bowel syndrome 11/13/2020   Microscopic hematuria 11/13/2020   Sacroiliitis, not elsewhere classified ([facility]) 11/13/2020   Vitamin D deficiency 11/13/2020   Claudication in peripheral vascular disease ([facility]) 09/25/2019   Peripheral arterial disease ([facility]) 09/12/2019   Essential hypertension 09/12/2019   Hyperlipidemia 09/12/2019    Allergies:  Allergies  Allergen Reactions   Ambien [Zolpidem Tartrate]    Atorvastatin     Other reaction(s): elevated liver tests   Denosumab     Other reaction(s): consipation   Elemental Sulfur     RASH ON ABDOMEN   Zolpidem     Other reaction(s): too strong   Sulfamethoxazole Rash  Medications:  Current Outpatient Medications:    cephALEXin (KEFLEX) 500 MG capsule, Take 1 capsule (500 mg total) by mouth 2 (two) times daily for 7 days., Disp: 14 capsule, Rfl: 0   Acetaminophen (TYLENOL ARTHRITIS PAIN PO), , Disp: , Rfl:    ALPRAZolam (XANAX) 0.5 MG tablet, Take 0.25 mg by mouth 3 (three) times daily as needed for anxiety. , Disp: , Rfl: 0    amLODipine (NORVASC) 2.5 MG tablet, Take 2.5 mg by mouth at bedtime. , Disp: , Rfl:    ASPIRIN LOW DOSE 81 MG EC tablet, TAKE 1 TABLET BY MOUTH EVERY DAY, Disp: 30 tablet, Rfl: 5   atenolol (TENORMIN) 100 MG tablet, Take 100 mg by mouth at bedtime. , Disp: , Rfl:    cholecalciferol (VITAMIN D) 1000 UNITS tablet, Take 1,000 Units by mouth daily., Disp: , Rfl:    clobetasol ointment (TEMOVATE) 0.05 %, SMARTSIG:Sparingly Topical Daily PRN, Disp: , Rfl:    Coenzyme Q10 (CO Q-10) 200 MG CAPS, See admin instructions., Disp: , Rfl:    COVID-19 mRNA bivalent vaccine, Pfizer, (PFIZER COVID-19 VAC BIVALENT) injection, Inject into the muscle., Disp: 0.3 mL, Rfl: 0   COVID-19 mRNA vaccine 2023-2024 (COMIRNATY) syringe, Inject into the muscle., Disp: 0.3 mL, Rfl: 0   Propylene Glycol (SYSTANE COMPLETE) 0.6 % SOLN, Place 1 drop into both eyes 3 (three) times daily as needed (dry/irritated eyes.)., Disp: , Rfl:    rosuvastatin (CRESTOR) 10 MG tablet, Take 10 mg by mouth daily., Disp: , Rfl:    RSV vaccine recomb adjuvanted (AREXVY) 120 MCG/0.5ML injection, Inject into the muscle., Disp: 0.5 mL, Rfl: 0   valsartan (DIOVAN) 320 MG tablet, 1 tablet, Disp: , Rfl:   Observations/Objective: Patient is well-developed, well-nourished in no acute distress.  Resting comfortably at home.  Head is normocephalic, atraumatic.  No labored breathing. Speech is clear and coherent with logical content.  Patient is alert and oriented at baseline.   Assessment and Plan: 1. Suspected UTI - cephALEXin (KEFLEX) 500 MG capsule; Take 1 capsule (500 mg total) by mouth 2 (two) times daily for 7 days.  Dispense: 14 capsule; Refill: 0  Classic UTI symptoms with absence of alarm signs or symptoms. Prior history of UTI. Will treat empirically with Keflex for suspected uncomplicated cystitis. Supportive measures and OTC medications reviewed. Strict in-person evaluation precautions discussed.    Follow Up Instructions: I discussed the  assessment and treatment plan with the patient. The patient was provided an opportunity to ask questions and all were answered. The patient agreed with the plan and demonstrated an understanding of the instructions.  A copy of instructions were sent to the patient via MyChart unless otherwise noted below.   The patient was advised to call back or seek an in-person evaluation if the symptoms worsen or if the condition fails to improve as anticipated.  Time:  I spent 10 minutes with the patient via telehealth technology discussing the above problems/concerns.    Diane Climes, PA-C

## 2022-11-11 DIAGNOSIS — M79671 Pain in right foot: Secondary | ICD-10-CM | POA: Diagnosis not present

## 2022-11-11 LAB — VAS US ABI WITH/WO TBI
Left ABI: 1.08
Right ABI: 0.95

## 2022-11-13 DIAGNOSIS — M79671 Pain in right foot: Secondary | ICD-10-CM | POA: Diagnosis not present

## 2022-11-16 DIAGNOSIS — M79671 Pain in right foot: Secondary | ICD-10-CM | POA: Diagnosis not present

## 2022-11-18 DIAGNOSIS — M79671 Pain in right foot: Secondary | ICD-10-CM | POA: Diagnosis not present

## 2022-11-23 ENCOUNTER — Encounter: Payer: Self-pay | Admitting: Family Medicine

## 2022-11-23 ENCOUNTER — Ambulatory Visit: Payer: Medicare Other | Admitting: Family Medicine

## 2022-11-23 VITALS — BP 108/72 | Ht 61.5 in | Wt 117.0 lb

## 2022-11-23 DIAGNOSIS — M25561 Pain in right knee: Secondary | ICD-10-CM | POA: Diagnosis not present

## 2022-11-23 DIAGNOSIS — M25551 Pain in right hip: Secondary | ICD-10-CM

## 2022-11-23 DIAGNOSIS — G8929 Other chronic pain: Secondary | ICD-10-CM | POA: Diagnosis not present

## 2022-11-23 DIAGNOSIS — M79671 Pain in right foot: Secondary | ICD-10-CM | POA: Diagnosis not present

## 2022-11-23 NOTE — Patient Instructions (Signed)
It was nice to meet you! You have gluteal tendinopathy, patellofemoral syndrome. Until these muscles (hip external rotators, abductors, knee VMO) get stronger this is going to continue to be a problem. Do home exercises on days you don't go to therapy. Heat or ice 15 minutes at a time 3-4 times a day. Medications aren't going to make this get better quicker so over the counter medications only as needed for pain. Follow up with me in 6 weeks for reevaluation.

## 2022-11-23 NOTE — Progress Notes (Signed)
PCP: Laurann Montana, MD  Subjective:   HPI: Patient is a 77 y.o. female here for right hip, knee, and foot pain.   History of right biplanar osteotomy in September 2023.Has had slow recovery. Pain worsening for the last 6 months. Had pin removal from right first metatarsal due to discomfort with some improvement in February.  Now having pain in arch of right foot and heel, states feels similar to plantar fascitis which she has had in the past. Also having R knee pain. Pain with walking up stairs. Radiated to the right calf Had joint injection about 1 month ago without improvement with [facility] Right lateral hip pain as well. Worse with turning, feels it is catching. Had injection of hip bursitis without improvement.  Able to walk about 1 mile, but pain limits further distance. Unable to play tennis since she had foot surgery. Using heat and ice, and tyelnol as needed. Is going to PT, feels it is not that helpful and not very directed.  Had MRI lumbar spine without evidence compressive radiculopathic source for pain in leg.  Past Medical History:  Diagnosis Date   Anxiety    Chronic back pain    GERD (gastroesophageal reflux disease)    Hematuria    Hypercholesteremia    Hypertension    Mitral valve prolapse    Osteoarthritis    right great toe   Osteopenia    Vitamin D deficiency     Current Outpatient Medications on File Prior to Visit  Medication Sig Dispense Refill   Acetaminophen (TYLENOL ARTHRITIS PAIN PO)      ALPRAZolam (XANAX) 0.5 MG tablet Take 0.25 mg by mouth 3 (three) times daily as needed for anxiety.   0   amLODipine (NORVASC) 2.5 MG tablet Take 2.5 mg by mouth at bedtime.      ASPIRIN LOW DOSE 81 MG EC tablet TAKE 1 TABLET BY MOUTH EVERY DAY 30 tablet 5   atenolol (TENORMIN) 100 MG tablet Take 100 mg by mouth at bedtime.      cholecalciferol (VITAMIN D) 1000 UNITS tablet Take 1,000 Units by mouth daily.     clobetasol ointment (TEMOVATE) 0.05 %  SMARTSIG:Sparingly Topical Daily PRN     Coenzyme Q10 (CO Q-10) 200 MG CAPS See admin instructions.     COVID-19 mRNA bivalent vaccine, Pfizer, (PFIZER COVID-19 VAC BIVALENT) injection Inject into the muscle. 0.3 mL 0   COVID-19 mRNA vaccine 2023-2024 (COMIRNATY) syringe Inject into the muscle. 0.3 mL 0   Propylene Glycol (SYSTANE COMPLETE) 0.6 % SOLN Place 1 drop into both eyes 3 (three) times daily as needed (dry/irritated eyes.).     rosuvastatin (CRESTOR) 10 MG tablet Take 10 mg by mouth daily.     RSV vaccine recomb adjuvanted (AREXVY) 120 MCG/0.5ML injection Inject into the muscle. 0.5 mL 0   valsartan (DIOVAN) 320 MG tablet 1 tablet     No current facility-administered medications on file prior to visit.    Past Surgical History:  Procedure Laterality Date   ABDOMINAL AORTOGRAM W/LOWER EXTREMITY Bilateral 09/25/2019   Procedure: ABDOMINAL AORTOGRAM W/LOWER EXTREMITY;  Surgeon: Runell Gess, MD;  Location: [facility] INVASIVE CV LAB;  Service: Cardiovascular;  Laterality: Bilateral;   APPENDECTOMY     COLONOSCOPY     PERIPHERAL VASCULAR ATHERECTOMY Right 09/25/2019   Procedure: PERIPHERAL VASCULAR ATHERECTOMY;  Surgeon: Runell Gess, MD;  Location: [facility] INVASIVE CV LAB;  Service: Cardiovascular;  Laterality: Right;  SFA with DCB   TUBAL LIGATION  Allergies  Allergen Reactions   Ambien [Zolpidem Tartrate]    Atorvastatin     Other reaction(s): elevated liver tests   Denosumab     Other reaction(s): consipation   Elemental Sulfur     RASH ON ABDOMEN   Zolpidem     Other reaction(s): too strong   Sulfamethoxazole Rash    BP 108/72   Ht 5' 1.5" (1.562 m)   Wt 117 lb (53.1 kg)   BMI 21.75 kg/m       No data to display              No data to display              Objective:  Physical Exam:  Gen: NAD, comfortable in exam room  Right hip No deformity. TTP of the greater trochanter and gluteal medius > external rotators. FROm of the hip. Weakness with  abduction and external rotation  Mild pain with FABER and FADIR but felt laterally.  Negative logroll. Negative SLR  Right knee No deformity, ecchymosis, or effusion. VMO atrophy. Minimal TTP of the medial and lateral joint lines FROM with moderate crepitus Positive J sign Negative mcmurray, apley  Right foot Post surgical change of the right great toe TTP of the plantar mid foot  Minimal ROM of the first MTP    Assessment & Plan:  1. Right knee pain-history of knee OA, not improved with injection suggesting different cause. Maltracking of the patella and continued pain, hip abduction weakness, VMO atrophy all point to patellofemoral syndrome. Conservative management with VMO strengthening, hip abduction strengthening, ice/heat, Tylenol and NSAIDs PRN. Follow up in 6 weeks    2. Right hip pain- likely gluteal tendinopathy. Home exercises for hip external rotators and abductors. Injection did not provide benefit for greater trochanteric pain syndrome.  No evidence radiculopathy on MRI.

## 2022-11-25 DIAGNOSIS — M79671 Pain in right foot: Secondary | ICD-10-CM | POA: Diagnosis not present

## 2022-11-27 ENCOUNTER — Encounter: Payer: Self-pay | Admitting: Cardiovascular Disease

## 2022-11-27 ENCOUNTER — Ambulatory Visit: Payer: Medicare Other | Attending: Cardiovascular Disease | Admitting: Cardiovascular Disease

## 2022-11-27 VITALS — BP 134/76 | HR 67 | Ht 61.5 in | Wt 115.6 lb

## 2022-11-27 DIAGNOSIS — R002 Palpitations: Secondary | ICD-10-CM | POA: Diagnosis not present

## 2022-11-27 DIAGNOSIS — I10 Essential (primary) hypertension: Secondary | ICD-10-CM | POA: Diagnosis not present

## 2022-11-27 DIAGNOSIS — E782 Mixed hyperlipidemia: Secondary | ICD-10-CM

## 2022-11-27 DIAGNOSIS — I739 Peripheral vascular disease, unspecified: Secondary | ICD-10-CM | POA: Diagnosis not present

## 2022-11-27 NOTE — Assessment & Plan Note (Signed)
History of central hypertension with blood pressure measured today at 134/76 although at home she says it is lower than this.  She is on amlodipine, atenolol and valsartan.

## 2022-11-27 NOTE — Assessment & Plan Note (Signed)
History of palpitations in the past on atenolol with monitor performed 11/12/2021 revealing frequent PACs, occasional PVCs and runs of SVT.  These seem not to be a problem now.

## 2022-11-27 NOTE — Assessment & Plan Note (Signed)
History of hyperlipidemia on statin therapy lipid profile performed 02/13/2022 revealing a total cholesterol 120, LDL of 46 and HDL of 59.

## 2022-11-27 NOTE — Assessment & Plan Note (Signed)
History of peripheral arterial disease initially referred to me by Dr. Charlsie Merles for claudication.  I performed peripheral angiography on her/19/21 revealing a 99% mid right SFA stenosis with one-vessel runoff bilaterally.  I performed directional atherectomy followed by drug-coated balloon angioplasty.  Claudication resolved her Dopplers improved.  Her most recent Dopplers reveal a right ABI of 0.95 and a left of 1.08 with a high-frequency signal in her distal right SFA unchanged from her Dopplers 1 year ago.  She denies claudication.  Will continue to follow her noninvasively on annual basis.

## 2022-11-27 NOTE — Patient Instructions (Addendum)
Medication Instructions:  Your physician recommends that you continue on your current medications as directed. Please refer to the Current Medication list given to you today.  *If you need a refill on your cardiac medications before your next appointment, please call your pharmacy*   Testing/Procedures: Your physician has requested that you have a lower extremity arterial duplex. During this test, ultrasound is used to evaluate arterial blood flow in the legs. Allow one hour for this exam. There are no restrictions or special instructions. This will take place at [address] [facility], [address]. To do in June 2025.  Your physician has requested that you have an ankle brachial index (ABI). During this test an ultrasound and blood pressure cuff are used to evaluate the arteries that supply the arms and legs with blood. Allow thirty minutes for this exam. There are no restrictions or special instructions. This will take place at [address] [facility], [address]. To do in June 2025.     Follow-Up: At [facility], you and your health needs are our priority.  As part of our continuing mission to provide you with exceptional heart care, we have created designated Provider Care Teams.  These Care Teams include your primary Cardiologist (physician) and Advanced Practice Providers (APPs -  Physician Assistants and Nurse Practitioners) who all work together to provide you with the care you need, when you need it.  We recommend signing up for the patient portal called "MyChart".  Sign up information is provided on this After Visit Summary.  MyChart is used to connect with patients for Virtual Visits (Telemedicine).  Patients are able to view lab/test results, encounter notes, upcoming appointments, etc.  Non-urgent messages can be sent to your provider as well.   To learn more about what you can do with MyChart, go to [email or web address].    Your next appointment:   6 month(s)  Provider:   Azalee Course, PA-C      Then, Nanetta Batty, MD will plan to see you again in 12 month(s).

## 2022-11-27 NOTE — Progress Notes (Signed)
11/27/2022 Diane Villa   1946/04/19  [id number]  Primary Physician Laurann Montana, MD Primary Cardiologist: Runell Gess MD Nicholes Calam[place], [place]  HPI:  Diane Villa is a 77 y.o.   thin and fit appearing widowed Caucasian female mother of 2 children, grandmother of 1 grandchild who was referred to me by Dr. Charlsie Merles, her podiatrist for right calf lifestyle limiting claudication.  I last saw her in the office 11/12/2021.Marland Kitchen She is a retired Management consultant.  Her only cardiovascular risk factors are treated hypertension and untreated hyperlipidemia.  There is no family history.  She is never had a heart attack or stroke.  She denies chest pain or shortness of breath.  She exercises on a daily basis 70 to 100 minutes a day including playing tennis.  She is noted right calf claudication over the last several months which is lifestyle limiting.  Dopplers performed 09/05/2019 revealed a right ankle-brachial index of 0.55 and a left of 1.03 with an occluded right SFA.     I performed peripheral angiography on her 09/25/2019 revealing subtotally occluded short segment pre-CTO mid right SFA with one-vessel runoff bilaterally via the peroneal artery.  I performed [facility] 1 directional atherectomy followed by drug-coated balloon angioplasty with excellent angiographic result.  Her claudication has resolved.  Her Dopplers have normalized.  She did have some ecchymosis in her left groin and suprapubic pubic area which tracked down the medial aspect of her left medial thigh and has since disappeared.     She has some right knee osteoarthritis with pain in her knee and foot although she denies claudication.  She has lost 20 pounds as result of diet and exercise since I last saw her.  Her lipid profile is excellent with an LDL in the 50 range.  I did get a coronary calcium score score and her blood 10/27/2019 which was 5.  She had recent Doppler studies that showed a high-frequency signal in her distal  right SFA on 04/18/2021 although she denies claudication.  Her right ABI dropped from 1.04 down to 0.88.  Since I saw her in the office a year ago she did have foot surgery by Dr. Charlsie Merles 02/17/2022 which she is still recovering from.  She was having palpitations which are better on atenolol.  She was fairly active, exercise daily and play tennis.  She did have a coronary calcium score performed 10/27/2019 which was 5.  Her claudication is after intervention and has not returned despite the fact that her most recent Dopplers continues to show high-frequency signal in her distal right SFA.   Current Meds  Medication Sig   Acetaminophen (TYLENOL ARTHRITIS PAIN PO)    ALPRAZolam (XANAX) 0.5 MG tablet Take 0.25 mg by mouth 3 (three) times daily as needed for anxiety.    amLODipine (NORVASC) 2.5 MG tablet Take 2.5 mg by mouth at bedtime.    ASPIRIN LOW DOSE 81 MG EC tablet TAKE 1 TABLET BY MOUTH EVERY DAY   atenolol (TENORMIN) 100 MG tablet Take 100 mg by mouth at bedtime.    cholecalciferol (VITAMIN D) 1000 UNITS tablet Take 1,000 Units by mouth daily.   clobetasol ointment (TEMOVATE) 0.05 % SMARTSIG:Sparingly Topical Daily PRN   Coenzyme Q10 (CO Q-10) 200 MG CAPS See admin instructions.   COVID-19 mRNA bivalent vaccine, Pfizer, (PFIZER COVID-19 VAC BIVALENT) injection Inject into the muscle.   COVID-19 mRNA vaccine 2023-2024 (COMIRNATY) syringe Inject into the muscle.   Propylene Glycol (SYSTANE  COMPLETE) 0.6 % SOLN Place 1 drop into both eyes 3 (three) times daily as needed (dry/irritated eyes.).   rosuvastatin (CRESTOR) 10 MG tablet Take 10 mg by mouth daily.   RSV vaccine recomb adjuvanted (AREXVY) 120 MCG/0.5ML injection Inject into the muscle.   valsartan (DIOVAN) 320 MG tablet 1 tablet     Allergies  Allergen Reactions   Ambien [Zolpidem Tartrate]    Atorvastatin     Other reaction(s): elevated liver tests   Denosumab     Other reaction(s): consipation   Elemental Sulfur     RASH ON  ABDOMEN   Zolpidem     Other reaction(s): too strong   Sulfamethoxazole Rash    Social History   Socioeconomic History   Marital status: Widowed    Spouse name: Not on file   Number of children: 2   Years of education: Not on file   Highest education level: Not on file  Occupational History   Occupation: retired    Comment: accounts payable  Tobacco Use   Smoking status: Never   Smokeless tobacco: Never  Vaping Use   Vaping Use: Never used  Substance and Sexual Activity   Alcohol use: No   Drug use: No   Sexual activity: Not on file  Other Topics Concern   Not on file  Social History Narrative   Not on file   Social Determinants of Health   Financial Resource Strain: Not on file  Food Insecurity: Not on file  Transportation Needs: Not on file  Physical Activity: Not on file  Stress: Not on file  Social Connections: Not on file  Intimate Partner Violence: Not on file     Review of Systems: General: negative for chills, fever, night sweats or weight changes.  Cardiovascular: negative for chest pain, dyspnea on exertion, edema, orthopnea, palpitations, paroxysmal nocturnal dyspnea or shortness of breath Dermatological: negative for rash Respiratory: negative for cough or wheezing Urologic: negative for hematuria Abdominal: negative for nausea, vomiting, diarrhea, bright red blood per rectum, melena, or hematemesis Neurologic: negative for visual changes, syncope, or dizziness All other systems reviewed and are otherwise negative except as noted above.    Blood pressure 134/76, pulse 67, height 5' 1.5" (1.562 m), weight 115 lb 9.6 oz (52.4 kg), SpO2 98 %.  General appearance: alert and no distress Neck: no adenopathy, no carotid bruit, no JVD, supple, symmetrical, trachea midline, and thyroid not enlarged, symmetric, no tenderness/mass/nodules Lungs: clear to auscultation bilaterally Heart: regular rate and rhythm, S1, S2 normal, no murmur, click, rub or  gallop Extremities: extremities normal, atraumatic, no cyanosis or edema Pulses: Decreased pedal pulses Skin: Skin color, texture, turgor normal. No rashes or lesions Neurologic: Grossly normal  EKG EKG Interpretation  Date/Time:  Friday November 27 2022 15:04:11 EDT Ventricular Rate:  67 PR Interval:  128 QRS Duration: 80 QT Interval:  390 QTC Calculation: 412 R Axis:   43 Text Interpretation: Normal sinus rhythm Nonspecific ST abnormality When compared with ECG of 22-Sep-2006 06:42, No significant change was found Confirmed by Nanetta Batty [phone]) on 11/27/2022 3:37:06 PM    ASSESSMENT AND PLAN:   Peripheral arterial disease (HCC) History of peripheral arterial disease initially referred to me by Dr. Charlsie Merles for claudication.  I performed peripheral angiography on her/19/21 revealing a 99% mid right SFA stenosis with one-vessel runoff bilaterally.  I performed directional atherectomy followed by drug-coated balloon angioplasty.  Claudication resolved her Dopplers improved.  Her most recent Dopplers reveal a right ABI of 0.95  and a left of 1.08 with a high-frequency signal in her distal right SFA unchanged from her Dopplers 1 year ago.  She denies claudication.  Will continue to follow her noninvasively on annual basis.  Essential hypertension History of central hypertension with blood pressure measured today at 134/76 although at home she says it is lower than this.  She is on amlodipine, atenolol and valsartan.  Hyperlipidemia History of hyperlipidemia on statin therapy lipid profile performed 02/13/2022 revealing a total cholesterol 120, LDL of 46 and HDL of 59.  Palpitations History of palpitations in the past on atenolol with monitor performed 11/12/2021 revealing frequent PACs, occasional PVCs and runs of SVT.  These seem not to be a problem now.     Runell Gess MD FACP,FACC,FAHA, [facility] 11/27/2022 3:49 PM

## 2022-11-30 DIAGNOSIS — M79671 Pain in right foot: Secondary | ICD-10-CM | POA: Diagnosis not present

## 2022-12-02 DIAGNOSIS — M79671 Pain in right foot: Secondary | ICD-10-CM | POA: Diagnosis not present

## 2022-12-07 ENCOUNTER — Other Ambulatory Visit: Payer: Self-pay | Admitting: Family Medicine

## 2022-12-07 DIAGNOSIS — Z1231 Encounter for screening mammogram for malignant neoplasm of breast: Secondary | ICD-10-CM

## 2022-12-16 DIAGNOSIS — M79671 Pain in right foot: Secondary | ICD-10-CM | POA: Diagnosis not present

## 2023-01-04 ENCOUNTER — Ambulatory Visit: Payer: Medicare Other | Admitting: Family Medicine

## 2023-01-04 VITALS — BP 124/86 | Ht 61.5 in | Wt 115.0 lb

## 2023-01-04 DIAGNOSIS — G8929 Other chronic pain: Secondary | ICD-10-CM | POA: Diagnosis not present

## 2023-01-04 DIAGNOSIS — M25561 Pain in right knee: Secondary | ICD-10-CM

## 2023-01-04 DIAGNOSIS — M25551 Pain in right hip: Secondary | ICD-10-CM

## 2023-01-04 NOTE — Patient Instructions (Signed)
Keep up the good work with the exercises! Add SI joint stretching for the right side. No worries about the popping in your knee - this will improve with continued rehab. Follow up with me in 6 weeks. I wouldn't recommend going back to tennis just yet.

## 2023-01-05 ENCOUNTER — Encounter: Payer: Self-pay | Admitting: Family Medicine

## 2023-01-05 NOTE — Progress Notes (Signed)
PCP: Laurann [place], MD  Subjective:   HPI: Patient is a 77 y.o. female here for right hip and knee pain.  6/17: History of right biplanar osteotomy in September 2023.Has had slow recovery. Pain worsening for the last 6 months. Had pin removal from right first metatarsal due to discomfort with some improvement in February.  Now having pain in arch of right foot and heel, states feels similar to plantar fascitis which she has had in the past. Also having R knee pain. Pain with walking up stairs. Radiated to the right calf Had joint injection about 1 month ago without improvement with [facility] Right lateral hip pain as well. Worse with turning, feels it is catching. Had injection of hip bursitis without improvement.  Able to walk about 1 mile, but pain limits further distance. Unable to play tennis since she had foot surgery. Using heat and ice, and tyelnol as needed. Is going to PT, feels it is not that helpful and not very directed.  Had MRI lumbar spine without evidence compressive radiculopathic source for pain in leg.  7/29: Patient reports she is doing well with home exercises. Pain is improving in both the right hip and knee. About 3 weeks ago pain was still pretty severe but she seemed to turn the corner then. When she does get pain it bothers her lateral right hip, can be worse to lie on this side.  Past Medical History:  Diagnosis Date   Anxiety    Chronic back pain    GERD (gastroesophageal reflux disease)    Hematuria    Hypercholesteremia    Hypertension    Mitral valve prolapse    Osteoarthritis    right great toe   Osteopenia    Vitamin D deficiency     Current Outpatient Medications on File Prior to Visit  Medication Sig Dispense Refill   Acetaminophen (TYLENOL ARTHRITIS PAIN PO)      ALPRAZolam (XANAX) 0.5 MG tablet Take 0.25 mg by mouth 3 (three) times daily as needed for anxiety.   0   amLODipine (NORVASC) 2.5 MG tablet Take 2.5 mg by mouth  at bedtime.      ASPIRIN LOW DOSE 81 MG EC tablet TAKE 1 TABLET BY MOUTH EVERY DAY 30 tablet 5   atenolol (TENORMIN) 100 MG tablet Take 100 mg by mouth at bedtime.      cholecalciferol (VITAMIN D) 1000 UNITS tablet Take 1,000 Units by mouth daily.     clobetasol ointment (TEMOVATE) 0.05 % SMARTSIG:Sparingly Topical Daily PRN     Coenzyme Q10 (CO Q-10) 200 MG CAPS See admin instructions.     COVID-19 mRNA bivalent vaccine, Pfizer, (PFIZER COVID-19 VAC BIVALENT) injection Inject into the muscle. 0.3 mL 0   COVID-19 mRNA vaccine 2023-2024 (COMIRNATY) syringe Inject into the muscle. 0.3 mL 0   Propylene Glycol (SYSTANE COMPLETE) 0.6 % SOLN Place 1 drop into both eyes 3 (three) times daily as needed (dry/irritated eyes.).     rosuvastatin (CRESTOR) 10 MG tablet Take 10 mg by mouth daily.     RSV vaccine recomb adjuvanted (AREXVY) 120 MCG/0.5ML injection Inject into the muscle. 0.5 mL 0   valsartan (DIOVAN) 320 MG tablet 1 tablet     No current facility-administered medications on file prior to visit.    Past Surgical History:  Procedure Laterality Date   ABDOMINAL AORTOGRAM W/LOWER EXTREMITY Bilateral 09/25/2019   Procedure: ABDOMINAL AORTOGRAM W/LOWER EXTREMITY;  Surgeon: Runell Gess, MD;  Location: [facility] INVASIVE CV LAB;  Service: Cardiovascular;  Laterality: Bilateral;   APPENDECTOMY     COLONOSCOPY     PERIPHERAL VASCULAR ATHERECTOMY Right 09/25/2019   Procedure: PERIPHERAL VASCULAR ATHERECTOMY;  Surgeon: Runell Gess, MD;  Location: [facility] INVASIVE CV LAB;  Service: Cardiovascular;  Laterality: Right;  SFA with DCB   TUBAL LIGATION      Allergies  Allergen Reactions   Ambien [Zolpidem Tartrate]    Atorvastatin     Other reaction(s): elevated liver tests   Denosumab     Other reaction(s): consipation   Elemental Sulfur     RASH ON ABDOMEN   Zolpidem     Other reaction(s): too strong   Sulfamethoxazole Rash    BP 124/86   Ht 5' 1.5" (1.562 m)   Wt 115 lb (52.2 kg)   BMI  21.38 kg/m       No data to display              No data to display              Objective:  Physical Exam:  Gen: NAD, comfortable in exam room  Right hip: No deformity. FROM with 5/5 strength. Mild tenderness SI joint NVI distally. Negative logroll Positive faber. Negative fadir, and piriformis stretches.  Right knee: No gross deformity, ecchymoses, swelling. No TTP . FROM with normal strength. Negative ant/post drawers. Negative valgus/varus testing. Negative lachman.  Negative mcmurrays, apleys.  NV intact distally.   Assessment & Plan:  1. Right hip pain -consistent with gluteal tendinopathy, possible greater trochanteric pain syndrome.  Doing much better with home exercise program so she will continue this.  2. Right knee pain -consistent with patellofemoral syndrome.  She still has notable VMO atrophy and was encouraged to continue with her home exercises focusing on strengthening this muscle.  Ice/heat, Tylenol or NSAIDs if needed.  Follow-up in 6 weeks.

## 2023-01-22 ENCOUNTER — Ambulatory Visit
Admission: RE | Admit: 2023-01-22 | Discharge: 2023-01-22 | Disposition: A | Discharge status: Home / Self Care | Payer: Medicare Other | Source: Ambulatory Visit | Attending: Family Medicine | Admitting: Family Medicine

## 2023-01-22 DIAGNOSIS — Z1231 Encounter for screening mammogram for malignant neoplasm of breast: Secondary | ICD-10-CM | POA: Diagnosis not present

## 2023-02-15 ENCOUNTER — Ambulatory Visit: Payer: Medicare Other | Admitting: Family Medicine

## 2023-02-15 VITALS — BP 110/64 | Ht 61.5 in | Wt 114.0 lb

## 2023-02-15 DIAGNOSIS — M25551 Pain in right hip: Secondary | ICD-10-CM

## 2023-02-15 DIAGNOSIS — M25561 Pain in right knee: Secondary | ICD-10-CM

## 2023-02-15 DIAGNOSIS — G8929 Other chronic pain: Secondary | ICD-10-CM | POA: Diagnosis not present

## 2023-02-15 DIAGNOSIS — M79671 Pain in right foot: Secondary | ICD-10-CM | POA: Diagnosis not present

## 2023-02-15 NOTE — Patient Instructions (Signed)
Continue the band/towel stretch of your plantar fascia as you have been, as well as using the black arch strap you purchased. Add a cushioned gel insert over the top of your orthotics. Try back to tennis as we discussed - 15 minutes hitting against the wall to start.  Don't play on back to back days. Follow up with me as needed.

## 2023-02-15 NOTE — Progress Notes (Unsigned)
PCP: Laurann Montana, MD  Subjective:   HPI: Patient is a 77 y.o. female here for follow up of Right Hip pain and Right knee pain, both treated 01/04/23 w/ home exercise and OTC pain meds.  Right Hip Thought to be gluteal tendonopathy or trochanteric pain syndrome. Report's it's doing much better. She can walk longer distances and is almost back to walking 2 miles. Notes some pain with steps, but is significantly improved. Has been continuing to do at home exercises.  Right Knee Thought to be patellofemoral syndrome. Reports it's doing much better. She really only gets knee pain if she overdoes her exercise and walks for too long/far. Reports she's been using tylenol as needed, 1350 mg prn. Has been continuing to do at home exercises.  Right Foot Has had pain in great toe, w/ arthritis requiring surgery, about a year ago. Toe has been painful since. Notes pain in ball and heel of foot, but better with arch support insert. Has been doing stretching exercises with thearaband that help a lot.  Only bothers when weightbearing.   Past Medical History:  Diagnosis Date   Anxiety    Chronic back pain    GERD (gastroesophageal reflux disease)    Hematuria    Hypercholesteremia    Hypertension    Mitral valve prolapse    Osteoarthritis    right great toe   Osteopenia    Vitamin D deficiency     Current Outpatient Medications on File Prior to Visit  Medication Sig Dispense Refill   Acetaminophen (TYLENOL ARTHRITIS PAIN PO)      ALPRAZolam (XANAX) 0.5 MG tablet Take 0.25 mg by mouth 3 (three) times daily as needed for anxiety.   0   amLODipine (NORVASC) 2.5 MG tablet Take 2.5 mg by mouth at bedtime.      ASPIRIN LOW DOSE 81 MG EC tablet TAKE 1 TABLET BY MOUTH EVERY DAY 30 tablet 5   atenolol (TENORMIN) 100 MG tablet Take 100 mg by mouth at bedtime.      cholecalciferol (VITAMIN D) 1000 UNITS tablet Take 1,000 Units by mouth daily.     clobetasol ointment (TEMOVATE) 0.05 % SMARTSIG:Sparingly  Topical Daily PRN     Coenzyme Q10 (CO Q-10) 200 MG CAPS See admin instructions.     COVID-19 mRNA bivalent vaccine, Pfizer, (PFIZER COVID-19 VAC BIVALENT) injection Inject into the muscle. 0.3 mL 0   COVID-19 mRNA vaccine 2023-2024 (COMIRNATY) syringe Inject into the muscle. 0.3 mL 0   Propylene Glycol (SYSTANE COMPLETE) 0.6 % SOLN Place 1 drop into both eyes 3 (three) times daily as needed (dry/irritated eyes.).     rosuvastatin (CRESTOR) 10 MG tablet Take 10 mg by mouth daily.     RSV vaccine recomb adjuvanted (AREXVY) 120 MCG/0.5ML injection Inject into the muscle. 0.5 mL 0   valsartan (DIOVAN) 320 MG tablet 1 tablet     No current facility-administered medications on file prior to visit.    Past Surgical History:  Procedure Laterality Date   ABDOMINAL AORTOGRAM W/LOWER EXTREMITY Bilateral 09/25/2019   Procedure: ABDOMINAL AORTOGRAM W/LOWER EXTREMITY;  Surgeon: Runell Gess, MD;  Location: [facility] IN[facility];  Service: Cardiovascular;  Laterality: Bilateral;   APPENDECTOMY     COLONOSCOPY     PERIPHERAL VASCULAR ATHERECTOMY Right 09/25/2019   Procedure: PERIPHERAL VASCULAR ATHERECTOMY;  Surgeon: Runell Gess, MD;  Location: [facility] INVASIVE CV LAB;  Service: Cardiovascular;  Laterality: Right;  SFA with DCB   TUBAL LIGATION  Allergies  Allergen Reactions   Ambien [Zolpidem Tartrate]    Atorvastatin     Other reaction(s): elevated liver tests   Denosumab     Other reaction(s): consipation   Elemental Sulfur     RASH ON ABDOMEN   Zolpidem     Other reaction(s): too strong   Sulfamethoxazole Rash    BP 110/64   Ht 5' 1.5" (1.562 m)   Wt 114 lb (51.7 kg)   BMI 21.19 kg/m       No data to display              No data to display              Objective:  Physical Exam: Gen: NAD, comfortable in exam room Right Hip: FROM, mild ttp at lateral border of hip, Negative FABER and FADIR, negative log roll. Right Knee: No ecchymosis, erythema, effusion, or  deformity, FROM, negative varus/valgus, negative thessely, negative anterior/posterior drawer test Right Foot: No ecchymosis, erythema, effusion or deformity, FROM about ankle, negative anterior drawer, no TTP throughout, NVI   Assessment & Plan:  1. Foot Pain Patient complaints of pain has been consistent for a year, post surgical procedure.  Patient notes pain when bearing weight, however foot is otherwise normal.  Patient appreciates arch support seems to help with foot pain, and eliminate foot issues when wearing them.  Patient's issues most concerning for plantar fasciitis as she has a history of this, and seems to be treated with stretches.  Will recommend continued at home exercises/stretches with towel/Thera-Band.  Will also recommend gel foot insert. - Gel foot insert on top of her orthotics - Thera-Band/towel for plantar stretches - Avoid typical plantar fasciitis stretches (calf raises/extensions), because of toe issue  2. Hip pain Patient reports hip pain is improved, pain thought to be secondary to gluteal tendinopathy versus trochanteric pain syndrome.  Patient reports good relief with at home exercises.  Will continue at home exercises.  Patient nervous about restarting tennis, but was encouraged to restart gradually and carefully. - Continue at home exercises - Follow-up as needed  3.  Knee pain Patient reports knee pain is improved.  Patient pain thought to be secondary to patellofemoral syndrome.  Patient reports good relief of pain with at home exercises.  Will continue at home exercises.  Patient nervous about restarting tennis, but was encouraged to restart gradually and carefully. - Continue at home exercises - Follow-up as needed

## 2023-02-16 ENCOUNTER — Other Ambulatory Visit (HOSPITAL_BASED_OUTPATIENT_CLINIC_OR_DEPARTMENT_OTHER): Payer: Self-pay

## 2023-02-16 ENCOUNTER — Encounter: Payer: Self-pay | Admitting: Family Medicine

## 2023-02-16 MED ORDER — FLUAD 0.5 ML IM SUSY
0.5000 mL | PREFILLED_SYRINGE | Freq: Once | INTRAMUSCULAR | 0 refills | Status: AC
  Filled 2023-02-16: qty 0.5, 1d supply, fill #0

## 2023-02-16 MED ORDER — COMIRNATY 30 MCG/0.3ML IM SUSY
0.3000 mL | PREFILLED_SYRINGE | Freq: Once | INTRAMUSCULAR | 0 refills | Status: AC
  Filled 2023-02-16: qty 0.3, 1d supply, fill #0

## 2023-02-26 DIAGNOSIS — Z1211 Encounter for screening for malignant neoplasm of colon: Secondary | ICD-10-CM | POA: Diagnosis not present

## 2023-02-26 DIAGNOSIS — Z0000 Encounter for general adult medical examination without abnormal findings: Secondary | ICD-10-CM | POA: Diagnosis not present

## 2023-02-26 DIAGNOSIS — E559 Vitamin D deficiency, unspecified: Secondary | ICD-10-CM | POA: Diagnosis not present

## 2023-02-26 DIAGNOSIS — M810 Age-related osteoporosis without current pathological fracture: Secondary | ICD-10-CM | POA: Diagnosis not present

## 2023-02-26 DIAGNOSIS — I739 Peripheral vascular disease, unspecified: Secondary | ICD-10-CM | POA: Diagnosis not present

## 2023-02-26 DIAGNOSIS — E785 Hyperlipidemia, unspecified: Secondary | ICD-10-CM | POA: Diagnosis not present

## 2023-02-26 DIAGNOSIS — I10 Essential (primary) hypertension: Secondary | ICD-10-CM | POA: Diagnosis not present

## 2023-03-09 ENCOUNTER — Encounter: Payer: Self-pay | Admitting: Nurse Practitioner

## 2023-03-10 DIAGNOSIS — R109 Unspecified abdominal pain: Secondary | ICD-10-CM | POA: Diagnosis not present

## 2023-03-15 DIAGNOSIS — I10 Essential (primary) hypertension: Secondary | ICD-10-CM | POA: Diagnosis not present

## 2023-03-15 DIAGNOSIS — L439 Lichen planus, unspecified: Secondary | ICD-10-CM | POA: Diagnosis not present

## 2023-03-15 DIAGNOSIS — M810 Age-related osteoporosis without current pathological fracture: Secondary | ICD-10-CM | POA: Diagnosis not present

## 2023-03-15 DIAGNOSIS — Z6821 Body mass index (BMI) 21.0-21.9, adult: Secondary | ICD-10-CM | POA: Diagnosis not present

## 2023-03-17 ENCOUNTER — Ambulatory Visit: Payer: Medicare Other | Admitting: Family Medicine

## 2023-03-17 VITALS — BP 120/70 | Ht 61.0 in | Wt 114.0 lb

## 2023-03-17 DIAGNOSIS — G8929 Other chronic pain: Secondary | ICD-10-CM

## 2023-03-17 DIAGNOSIS — M545 Low back pain, unspecified: Secondary | ICD-10-CM

## 2023-03-17 DIAGNOSIS — M79671 Pain in right foot: Secondary | ICD-10-CM

## 2023-03-17 DIAGNOSIS — M25561 Pain in right knee: Secondary | ICD-10-CM

## 2023-03-17 NOTE — Patient Instructions (Signed)
Get x-rays tomorrow of your foot and knee. Take tylenol arthritis three times a day. Tizanidine as needed for spasms of your back. Heat for this also 15 minutes at a time as needed. I'll call you with the x-ray results and next steps.

## 2023-03-18 ENCOUNTER — Encounter: Payer: Self-pay | Admitting: Family Medicine

## 2023-03-18 ENCOUNTER — Other Ambulatory Visit: Payer: Self-pay | Admitting: Family Medicine

## 2023-03-18 ENCOUNTER — Ambulatory Visit
Admission: RE | Admit: 2023-03-18 | Discharge: 2023-03-18 | Disposition: A | Discharge status: Home / Self Care | Payer: Medicare Other | Source: Ambulatory Visit | Attending: Family Medicine | Admitting: Family Medicine

## 2023-03-18 DIAGNOSIS — M79671 Pain in right foot: Secondary | ICD-10-CM

## 2023-03-18 DIAGNOSIS — M76891 Other specified enthesopathies of right lower limb, excluding foot: Secondary | ICD-10-CM | POA: Diagnosis not present

## 2023-03-18 DIAGNOSIS — M85871 Other specified disorders of bone density and structure, right ankle and foot: Secondary | ICD-10-CM | POA: Diagnosis not present

## 2023-03-18 DIAGNOSIS — G8929 Other chronic pain: Secondary | ICD-10-CM

## 2023-03-18 DIAGNOSIS — M19071 Primary osteoarthritis, right ankle and foot: Secondary | ICD-10-CM | POA: Diagnosis not present

## 2023-03-18 NOTE — Progress Notes (Signed)
PCP: Laurann Montana, MD  Subjective:   HPI: Patient is a 77 y.o. female here for right knee, right foot pain.  Patient reports her right knee pain has worsened since last visit. Pain anterior, posterior patellar. No injury or trauma. Has been taking tylenol arthritis and tizanidine. Recent UTI so taking keflex. Her right foot pain is still located medially over distal 1st metatarsal, 1st MTP, 1st digit.  Worse with ambulation.  Past Medical History:  Diagnosis Date   Anxiety    Chronic back pain    GERD (gastroesophageal reflux disease)    Hematuria    Hypercholesteremia    Hypertension    Mitral valve prolapse    Osteoarthritis    right great toe   Osteopenia    Vitamin D deficiency     Current Outpatient Medications on File Prior to Visit  Medication Sig Dispense Refill   Acetaminophen (TYLENOL ARTHRITIS PAIN PO)      ALPRAZolam (XANAX) 0.5 MG tablet Take 0.25 mg by mouth 3 (three) times daily as needed for anxiety.   0   amLODipine (NORVASC) 2.5 MG tablet Take 2.5 mg by mouth at bedtime.      ASPIRIN LOW DOSE 81 MG EC tablet TAKE 1 TABLET BY MOUTH EVERY DAY 30 tablet 5   atenolol (TENORMIN) 100 MG tablet Take 100 mg by mouth at bedtime.      cholecalciferol (VITAMIN D) 1000 UNITS tablet Take 1,000 Units by mouth daily.     clobetasol ointment (TEMOVATE) 0.05 % SMARTSIG:Sparingly Topical Daily PRN     Coenzyme Q10 (CO Q-10) 200 MG CAPS See admin instructions.     COVID-19 mRNA bivalent vaccine, Pfizer, (PFIZER COVID-19 VAC BIVALENT) injection Inject into the muscle. 0.3 mL 0   COVID-19 mRNA vaccine 2023-2024 (COMIRNATY) syringe Inject into the muscle. 0.3 mL 0   Propylene Glycol (SYSTANE COMPLETE) 0.6 % SOLN Place 1 drop into both eyes 3 (three) times daily as needed (dry/irritated eyes.).     rosuvastatin (CRESTOR) 10 MG tablet Take 10 mg by mouth daily.     RSV vaccine recomb adjuvanted (AREXVY) 120 MCG/0.5ML injection Inject into the muscle. 0.5 mL 0   valsartan  (DIOVAN) 320 MG tablet 1 tablet     No current facility-administered medications on file prior to visit.    Past Surgical History:  Procedure Laterality Date   ABDOMINAL AORTOGRAM W/LOWER EXTREMITY Bilateral 09/25/2019   Procedure: ABDOMINAL AORTOGRAM W/LOWER EXTREMITY;  Surgeon: Runell Gess, MD;  Location: [facility] INVASIVE CV LAB;  Service: Cardiovascular;  Laterality: Bilateral;   APPENDECTOMY     COLONOSCOPY     PERIPHERAL VASCULAR ATHERECTOMY Right 09/25/2019   Procedure: PERIPHERAL VASCULAR ATHERECTOMY;  Surgeon: Runell Gess, MD;  Location: [facility] INVASIVE CV LAB;  Service: Cardiovascular;  Laterality: Right;  SFA with DCB   TUBAL LIGATION      Allergies  Allergen Reactions   Ambien [Zolpidem Tartrate]    Atorvastatin     Other reaction(s): elevated liver tests   Denosumab     Other reaction(s): consipation   Elemental Sulfur     RASH ON ABDOMEN   Zolpidem     Other reaction(s): too strong   Sulfamethoxazole Rash    BP 120/70   Ht 5\' 1"  (1.549 m)   Wt 114 lb (51.7 kg)   BMI 21.54 kg/m       No data to display              No data to display  Objective:  Physical Exam:  Gen: NAD, comfortable in exam room  Right knee: No gross deformity, ecchymoses, swelling. TTP Post patellar facets. FROM with normal strength. Negative ant/post drawers. Negative valgus/varus testing. Negative lachman.  Negative mcmurrays, apleys.  NV intact distally.  Right foot/ankle: No gross deformity, swelling, ecchymoses Full range of motion except minimal motion 1st MTP.  Mild limitation 1st IP joint and mild pain Mild tenderness to palpation 1st MTP and IP joints. Negative ant drawer and negative talar tilt.   NV intact distally.   Assessment & Plan:  1. Right knee pain - not improving with conservative measures.  Still localizes to post patellar area.  Will proceed with radiographs to assess.  Continue her tylenol, heat.  Consider steroid injection  depending on results.  Continue quad strengthening.  2. Right foot pain - s/p fusion 1st MTP joint.  Still with notable pain medially - ? Neuropathic component.  Will proceed with radiographs.  May consider MRI given level of her pain not improving with arch supports, home exercises.    She then also noted some left sided low back pain - tender over paraspinal muscles here.  Tizanidine as needed, heat.

## 2023-04-02 DIAGNOSIS — H524 Presbyopia: Secondary | ICD-10-CM | POA: Diagnosis not present

## 2023-04-02 DIAGNOSIS — H26493 Other secondary cataract, bilateral: Secondary | ICD-10-CM | POA: Diagnosis not present

## 2023-04-02 DIAGNOSIS — H5203 Hypermetropia, bilateral: Secondary | ICD-10-CM | POA: Diagnosis not present

## 2023-04-02 DIAGNOSIS — H43393 Other vitreous opacities, bilateral: Secondary | ICD-10-CM | POA: Diagnosis not present

## 2023-04-02 DIAGNOSIS — H53143 Visual discomfort, bilateral: Secondary | ICD-10-CM | POA: Diagnosis not present

## 2023-04-02 DIAGNOSIS — H52223 Regular astigmatism, bilateral: Secondary | ICD-10-CM | POA: Diagnosis not present

## 2023-04-02 DIAGNOSIS — H01009 Unspecified blepharitis unspecified eye, unspecified eyelid: Secondary | ICD-10-CM | POA: Diagnosis not present

## 2023-04-02 DIAGNOSIS — H1045 Other chronic allergic conjunctivitis: Secondary | ICD-10-CM | POA: Diagnosis not present

## 2023-04-02 DIAGNOSIS — H04123 Dry eye syndrome of bilateral lacrimal glands: Secondary | ICD-10-CM | POA: Diagnosis not present

## 2023-04-19 ENCOUNTER — Encounter: Payer: Self-pay | Admitting: Family Medicine

## 2023-04-19 ENCOUNTER — Ambulatory Visit: Payer: Medicare Other | Admitting: Family Medicine

## 2023-04-19 VITALS — BP 106/72 | Ht 61.5 in | Wt 115.0 lb

## 2023-04-19 DIAGNOSIS — M79671 Pain in right foot: Secondary | ICD-10-CM

## 2023-04-19 DIAGNOSIS — M25561 Pain in right knee: Secondary | ICD-10-CM

## 2023-04-19 DIAGNOSIS — G8929 Other chronic pain: Secondary | ICD-10-CM

## 2023-04-19 MED ORDER — METHYLPREDNISOLONE ACETATE 40 MG/ML IJ SUSP
40.0000 mg | Freq: Once | INTRAMUSCULAR | Status: AC
  Administered 2023-04-19: 40 mg via INTRA_ARTICULAR

## 2023-04-19 NOTE — Progress Notes (Signed)
PCP: Laurann Montana, MD  Subjective:   HPI: Patient is a 77 y.o. female here for follow-up of right knee and right foot pain.  Was last seen 03/17/2023 for continued knee and foot pain.  Has been taking Tylenol arthritis as well as using a compression sleeve for her knee.  Pain is mostly when walking.  Not much pain at rest.  Her pain sometimes migrates in both the foot and knee; however, most of the time the pain is all around her knee anteriorly, her right great toe, and around her medial calf.  No swelling or erythema noted.  Is going to be active for Thanksgiving with family and wants to ensure she is doing all the right things for pain.  Past Medical History:  Diagnosis Date   Anxiety    Chronic back pain    GERD (gastroesophageal reflux disease)    Hematuria    Hypercholesteremia    Hypertension    Mitral valve prolapse    Osteoarthritis    right great toe   Osteopenia    Vitamin D deficiency     Current Outpatient Medications on File Prior to Visit  Medication Sig Dispense Refill   Acetaminophen (TYLENOL ARTHRITIS PAIN PO)      ALPRAZolam (XANAX) 0.5 MG tablet Take 0.25 mg by mouth 3 (three) times daily as needed for anxiety.   0   amLODipine (NORVASC) 2.5 MG tablet Take 2.5 mg by mouth at bedtime.      ASPIRIN LOW DOSE 81 MG EC tablet TAKE 1 TABLET BY MOUTH EVERY DAY 30 tablet 5   atenolol (TENORMIN) 100 MG tablet Take 100 mg by mouth at bedtime.      cholecalciferol (VITAMIN D) 1000 UNITS tablet Take 1,000 Units by mouth daily.     clobetasol ointment (TEMOVATE) 0.05 % SMARTSIG:Sparingly Topical Daily PRN     Coenzyme Q10 (CO Q-10) 200 MG CAPS See admin instructions.     COVID-19 mRNA bivalent vaccine, Pfizer, (PFIZER COVID-19 VAC BIVALENT) injection Inject into the muscle. 0.3 mL 0   COVID-19 mRNA vaccine 2023-2024 (COMIRNATY) syringe Inject into the muscle. 0.3 mL 0   Propylene Glycol (SYSTANE COMPLETE) 0.6 % SOLN Place 1 drop into both eyes 3 (three) times daily as  needed (dry/irritated eyes.).     rosuvastatin (CRESTOR) 10 MG tablet Take 10 mg by mouth daily.     RSV vaccine recomb adjuvanted (AREXVY) 120 MCG/0.5ML injection Inject into the muscle. 0.5 mL 0   valsartan (DIOVAN) 320 MG tablet 1 tablet     No current facility-administered medications on file prior to visit.    Past Surgical History:  Procedure Laterality Date   ABDOMINAL AORTOGRAM W/LOWER EXTREMITY Bilateral 09/25/2019   Procedure: ABDOMINAL AORTOGRAM W/LOWER EXTREMITY;  Surgeon: Runell Gess, MD;  Location: [facility] INVASIVE CV LAB;  Service: Cardiovascular;  Laterality: Bilateral;   APPENDECTOMY     COLONOSCOPY     PERIPHERAL VASCULAR ATHERECTOMY Right 09/25/2019   Procedure: PERIPHERAL VASCULAR ATHERECTOMY;  Surgeon: Runell Gess, MD;  Location: [facility] INVASIVE CV LAB;  Service: Cardiovascular;  Laterality: Right;  SFA with DCB   TUBAL LIGATION      Allergies  Allergen Reactions   Ambien [Zolpidem Tartrate]    Atorvastatin     Other reaction(s): elevated liver tests   Denosumab     Other reaction(s): consipation   Elemental Sulfur     RASH ON ABDOMEN   Zolpidem     Other reaction(s): too strong   Sulfamethoxazole  Rash    BP 106/72   Ht 5' 1.5" (1.562 m)   Wt 115 lb (52.2 kg)   BMI 21.38 kg/m       No data to display              No data to display              Objective:  Physical Exam:  Gen: NAD, comfortable in exam room  Right knee No gross deformity, ecchymoses, swelling. TTP over medial joint line/femoral condyle. FROM with normal strength. Negative ant/post drawers.  Pain with valgus testing without laxity. Negative lachman. Negative apleys NV intact distally.  Right foot No gross deformity, swelling, ecchymoses Very limited range of motion at first MTP and IP Point mild tenderness to palpation of MTP Negative ant drawer and negative talar tilt.   NV intact distally.    Assessment & Plan:  1.  Right knee pain: Stable though not  improving with more pain around medial knee.  Likely due to arthritis, though right knee x-ray reassuring.  Given continued symptoms, performed ultrasound-guided knee injection as below. Continue home exercises.  2.  Right foot pain: Stable although not improving.  Likely due to severe arthritis at 1st MTP as seen on x-ray as well as neuropathic component given pain into the great toe and leg.  Discussed continuing supportive care with home exercises, arch supports, activity. Can consider gabapentin in future if not improving.  Right knee corticosteroid injection procedure note: After informed written consent timeout was performed, patient was lying supine on exam table. Right knee was prepped with alcohol swab and utilizing ultrasound guidance with superolateral approach, patient's right knee was injected with 3:1 lidocaine: depomedrol. Patient tolerated the procedure well without immediate complications.   Follow-up in 1 month or as needed.  Janeal Holmes, MD PGY-2, [facility]

## 2023-04-25 ENCOUNTER — Telehealth: Payer: Medicare Other | Admitting: Family

## 2023-04-25 ENCOUNTER — Encounter: Payer: Self-pay | Admitting: Family

## 2023-04-25 DIAGNOSIS — R399 Unspecified symptoms and signs involving the genitourinary system: Secondary | ICD-10-CM | POA: Diagnosis not present

## 2023-04-25 MED ORDER — CEPHALEXIN 500 MG PO CAPS
500.0000 mg | ORAL_CAPSULE | Freq: Two times a day (BID) | ORAL | 0 refills | Status: DC
Start: 2023-04-25 — End: 2023-05-27

## 2023-04-25 NOTE — Progress Notes (Signed)
Virtual Visit Consent   Diane Villa, you are scheduled for a virtual visit with a [facility] provider today. Just as with appointments in the office, your consent must be obtained to participate. Your consent will be active for this visit and any virtual visit you may have with one of our providers in the next 365 days. If you have a MyChart account, a copy of this consent can be sent to you electronically.  As this is a virtual visit, video technology does not allow for your provider to perform a traditional examination. This may limit your provider's ability to fully assess your condition. If your provider identifies any concerns that need to be evaluated in person or the need to arrange testing (such as labs, EKG, etc.), we will make arrangements to do so. Although advances in technology are sophisticated, we cannot ensure that it will always work on either your end or our end. If the connection with a video visit is poor, the visit may have to be switched to a telephone visit. With either a video or telephone visit, we are not always able to ensure that we have a secure connection.  By engaging in this virtual visit, you consent to the provision of healthcare and authorize for your insurance to be billed (if applicable) for the services provided during this visit. Depending on your insurance coverage, you may receive a charge related to this service.  I need to obtain your verbal consent now. Are you willing to proceed with your visit today? Diane Villa has provided verbal consent on 04/25/2023 for a virtual visit (video or telephone). Jannifer Rodney, FNP  Date: 04/25/2023 9:24 AM  Virtual Visit via Video Note   I, Jannifer Rodney, connected with  Diane Villa  ([id number], [id number]) on 04/25/23 at 10:00 AM EST by a video-enabled telemedicine application and verified that I am speaking with the correct person using two identifiers.  Location: Patient: Virtual Visit Location Patient:  Home Provider: Virtual Visit Location Provider: Home Office   I discussed the limitations of evaluation and management by telemedicine and the availability of in person appointments. The patient expressed understanding and agreed to proceed.    History of Present Illness: Diane Villa is a 77 y.o. who identifies as a female who was assigned female at birth, and is being seen today for dysuria that started yesterday.  HPI: Urinary Frequency  This is a new problem. The current episode started yesterday. The problem occurs intermittently. The problem has been gradually worsening. The quality of the pain is described as burning. The pain is at a severity of 2/10. The pain is mild. Associated symptoms include frequency, hematuria, hesitancy and urgency. Pertinent negatives include no flank pain, nausea or vomiting. She has tried increased fluids for the symptoms. The treatment provided mild relief.    Problems:  Patient Active Problem List   Diagnosis Date Noted   Palpitations 11/12/2021   Preoperative clearance 11/12/2021   Age-related osteoporosis without current pathological fracture 11/13/2020   Anxiety 11/13/2020   Chronic insomnia 11/13/2020   Gastroesophageal reflux disease 11/13/2020   Irritable bowel syndrome 11/13/2020   Microscopic hematuria 11/13/2020   Sacroiliitis, not elsewhere classified (HCC) 11/13/2020   Vitamin D deficiency 11/13/2020   Claudication in peripheral vascular disease (HCC) 09/25/2019   Peripheral arterial disease (HCC) 09/12/2019   Essential hypertension 09/12/2019   Hyperlipidemia 09/12/2019    Allergies:  Allergies  Allergen Reactions   Ambien [Zolpidem Tartrate]    Atorvastatin  Other reaction(s): elevated liver tests   Denosumab     Other reaction(s): consipation   Elemental Sulfur     RASH ON ABDOMEN   Zolpidem     Other reaction(s): too strong   Sulfamethoxazole Rash   Medications:  Current Outpatient Medications:    cephALEXin  (KEFLEX) 500 MG capsule, Take 1 capsule (500 mg total) by mouth 2 (two) times daily., Disp: 14 capsule, Rfl: 0   Acetaminophen (TYLENOL ARTHRITIS PAIN PO), , Disp: , Rfl:    ALPRAZolam (XANAX) 0.5 MG tablet, Take 0.25 mg by mouth 3 (three) times daily as needed for anxiety. , Disp: , Rfl: 0   amLODipine (NORVASC) 2.5 MG tablet, Take 2.5 mg by mouth at bedtime. , Disp: , Rfl:    ASPIRIN LOW DOSE 81 MG EC tablet, TAKE 1 TABLET BY MOUTH EVERY DAY, Disp: 30 tablet, Rfl: 5   atenolol (TENORMIN) 100 MG tablet, Take 100 mg by mouth at bedtime. , Disp: , Rfl:    cholecalciferol (VITAMIN D) 1000 UNITS tablet, Take 1,000 Units by mouth daily., Disp: , Rfl:    clobetasol ointment (TEMOVATE) 0.05 %, SMARTSIG:Sparingly Topical Daily PRN, Disp: , Rfl:    Coenzyme Q10 (CO Q-10) 200 MG CAPS, See admin instructions., Disp: , Rfl:    COVID-19 mRNA bivalent vaccine, Pfizer, (PFIZER COVID-19 VAC BIVALENT) injection, Inject into the muscle., Disp: 0.3 mL, Rfl: 0   COVID-19 mRNA vaccine 2023-2024 (COMIRNATY) syringe, Inject into the muscle., Disp: 0.3 mL, Rfl: 0   Propylene Glycol (SYSTANE COMPLETE) 0.6 % SOLN, Place 1 drop into both eyes 3 (three) times daily as needed (dry/irritated eyes.)., Disp: , Rfl:    rosuvastatin (CRESTOR) 10 MG tablet, Take 10 mg by mouth daily., Disp: , Rfl:    RSV vaccine recomb adjuvanted (AREXVY) 120 MCG/0.5ML injection, Inject into the muscle., Disp: 0.5 mL, Rfl: 0   valsartan (DIOVAN) 320 MG tablet, 1 tablet, Disp: , Rfl:   Observations/Objective: Patient is well-developed, well-nourished in no acute distress.  Resting comfortably  at home.  Head is normocephalic, atraumatic.  No labored breathing.  Speech is clear and coherent with logical content.  Patient is alert and oriented at baseline.    Assessment and Plan: 1. UTI symptoms - cephALEXin (KEFLEX) 500 MG capsule; Take 1 capsule (500 mg total) by mouth 2 (two) times daily.  Dispense: 14 capsule; Refill: 0  Force  fluids AZO over the counter X2 days Follow up if symptoms worsen or do not improve   Follow Up Instructions: I discussed the assessment and treatment plan with the patient. The patient was provided an opportunity to ask questions and all were answered. The patient agreed with the plan and demonstrated an understanding of the instructions.  A copy of instructions were sent to the patient via MyChart unless otherwise noted below.     The patient was advised to call back or seek an in-person evaluation if the symptoms worsen or if the condition fails to improve as anticipated.    Jannifer Rodney, FNP

## 2023-04-28 DIAGNOSIS — Z87440 Personal history of urinary (tract) infections: Secondary | ICD-10-CM | POA: Diagnosis not present

## 2023-05-12 DIAGNOSIS — Z87440 Personal history of urinary (tract) infections: Secondary | ICD-10-CM | POA: Diagnosis not present

## 2023-05-12 DIAGNOSIS — N390 Urinary tract infection, site not specified: Secondary | ICD-10-CM | POA: Diagnosis not present

## 2023-05-16 ENCOUNTER — Telehealth: Payer: Medicare Other | Admitting: Family Medicine

## 2023-05-16 DIAGNOSIS — N390 Urinary tract infection, site not specified: Secondary | ICD-10-CM | POA: Diagnosis not present

## 2023-05-16 MED ORDER — NITROFURANTOIN MONOHYD MACRO 100 MG PO CAPS: 100.0000 mg | ORAL_CAPSULE | Freq: Two times a day (BID) | ORAL | 0 refills | Status: AC

## 2023-05-16 NOTE — Progress Notes (Signed)
Virtual Visit Consent   Diane Villa, you are scheduled for a virtual visit with a [place] provider today. Just as with appointments in the office, your consent must be obtained to participate. Your consent will be active for this visit and any virtual visit you may have with one of our providers in the next 365 days. If you have a MyChart account, a copy of this consent can be sent to you electronically.  As this is a virtual visit, video technology does not allow for your provider to perform a traditional examination. This may limit your provider's ability to fully assess your condition. If your provider identifies any concerns that need to be evaluated in person or the need to arrange testing (such as labs, EKG, etc.), we will make arrangements to do so. Although advances in technology are sophisticated, we cannot ensure that it will always work on either your end or our end. If the connection with a video visit is poor, the visit may have to be switched to a telephone visit. With either a video or telephone visit, we are not always able to ensure that we have a secure connection.  By engaging in this virtual visit, you consent to the provision of healthcare and authorize for your insurance to be billed (if applicable) for the services provided during this visit. Depending on your insurance coverage, you may receive a charge related to this service.  I need to obtain your verbal consent now. Are you willing to proceed with your visit today? Diane Villa has provided verbal consent on 05/16/2023 for a virtual visit (video or telephone). Georgana Curio, FNP  Date: 05/16/2023 7:17 PM  Virtual Visit via Video Note   I, Georgana Curio, connected with  Diane Villa  ([id number], 03-17-1946) on 05/16/23 at  7:30 PM EST by a video-enabled telemedicine application and verified that I am speaking with the correct person using two identifiers.  Location: Patient: Virtual Visit Location Patient:  Home Provider: Virtual Visit Location Provider: Home Office   I discussed the limitations of evaluation and management by telemedicine and the availability of in person appointments. The patient expressed understanding and agreed to proceed.    History of Present Illness: Diane Villa is a 77 y.o. who identifies as a female who was assigned female at birth, and is being seen today for burning and frequency on urination. No fever. Seen by GYN last week with urine culture pending. She will call them tomorrow for results. Not visible in chart. Marland Kitchen  HPI: HPI  Problems:  Patient Active Problem List   Diagnosis Date Noted   Palpitations 11/12/2021   Preoperative clearance 11/12/2021   Age-related osteoporosis without current pathological fracture 11/13/2020   Anxiety 11/13/2020   Chronic insomnia 11/13/2020   Gastroesophageal reflux disease 11/13/2020   Irritable bowel syndrome 11/13/2020   Microscopic hematuria 11/13/2020   Sacroiliitis, not elsewhere classified (HCC) 11/13/2020   Vitamin D deficiency 11/13/2020   Claudication in peripheral vascular disease (HCC) 09/25/2019   Peripheral arterial disease (HCC) 09/12/2019   Essential hypertension 09/12/2019   Hyperlipidemia 09/12/2019    Allergies:  Allergies  Allergen Reactions   Ambien [Zolpidem Tartrate]    Atorvastatin     Other reaction(s): elevated liver tests   Denosumab     Other reaction(s): consipation   Elemental Sulfur     RASH ON ABDOMEN   Zolpidem     Other reaction(s): too strong   Sulfamethoxazole Rash   Medications:  Current  Outpatient Medications:    nitrofurantoin, macrocrystal-monohydrate, (MACROBID) 100 MG capsule, Take 1 capsule (100 mg total) by mouth 2 (two) times daily for 7 days., Disp: 14 capsule, Rfl: 0   Acetaminophen (TYLENOL ARTHRITIS PAIN PO), , Disp: , Rfl:    ALPRAZolam (XANAX) 0.5 MG tablet, Take 0.25 mg by mouth 3 (three) times daily as needed for anxiety. , Disp: , Rfl: 0   amLODipine  (NORVASC) 2.5 MG tablet, Take 2.5 mg by mouth at bedtime. , Disp: , Rfl:    ASPIRIN LOW DOSE 81 MG EC tablet, TAKE 1 TABLET BY MOUTH EVERY DAY, Disp: 30 tablet, Rfl: 5   atenolol (TENORMIN) 100 MG tablet, Take 100 mg by mouth at bedtime. , Disp: , Rfl:    cephALEXin (KEFLEX) 500 MG capsule, Take 1 capsule (500 mg total) by mouth 2 (two) times daily., Disp: 14 capsule, Rfl: 0   cholecalciferol (VITAMIN D) 1000 UNITS tablet, Take 1,000 Units by mouth daily., Disp: , Rfl:    clobetasol ointment (TEMOVATE) 0.05 %, SMARTSIG:Sparingly Topical Daily PRN, Disp: , Rfl:    Coenzyme Q10 (CO Q-10) 200 MG CAPS, See admin instructions., Disp: , Rfl:    COVID-19 mRNA bivalent vaccine, Pfizer, (PFIZER COVID-19 VAC BIVALENT) injection, Inject into the muscle., Disp: 0.3 mL, Rfl: 0   COVID-19 mRNA vaccine 2023-2024 (COMIRNATY) syringe, Inject into the muscle., Disp: 0.3 mL, Rfl: 0   Propylene Glycol (SYSTANE COMPLETE) 0.6 % SOLN, Place 1 drop into both eyes 3 (three) times daily as needed (dry/irritated eyes.)., Disp: , Rfl:    rosuvastatin (CRESTOR) 10 MG tablet, Take 10 mg by mouth daily., Disp: , Rfl:    RSV vaccine recomb adjuvanted (AREXVY) 120 MCG/0.5ML injection, Inject into the muscle., Disp: 0.5 mL, Rfl: 0   valsartan (DIOVAN) 320 MG tablet, 1 tablet, Disp: , Rfl:   Observations/Objective: Patient is well-developed, well-nourished in no acute distress.  Resting comfortably  at home.  Head is normocephalic, atraumatic.  No labored breathing. Speech is clear and coherent with logical content.  Patient is alert and oriented at baseline.    Assessment and Plan: 1. Urinary tract infection without hematuria, site unspecified  Increase fluids, preventative measures, follow up with GYN tomorrow.   Follow Up Instructions: I discussed the assessment and treatment plan with the patient. The patient was provided an opportunity to ask questions and all were answered. The patient agreed with the plan and  demonstrated an understanding of the instructions.  A copy of instructions were sent to the patient via MyChart unless otherwise noted below.     The patient was advised to call back or seek an in-person evaluation if the symptoms worsen or if the condition fails to improve as anticipated.    Georgana Curio, FNP

## 2023-05-16 NOTE — Patient Instructions (Signed)
Urinary Tract Infection, Adult A urinary tract infection (UTI) is an infection of any part of the urinary tract. The urinary tract includes: The kidneys. The ureters. The bladder. The urethra. These organs make, store, and get rid of pee (urine) in the body. What are the causes? This infection is caused by germs (bacteria) in your genital area. These germs grow and cause swelling (inflammation) of your urinary tract. What increases the risk? The following factors may make you more likely to develop this condition: Using a small, thin tube (catheter) to drain pee. Not being able to control when you pee or poop (incontinence). Being female. If you are female, these things can increase the risk: Using these methods to prevent pregnancy: A medicine that kills sperm (spermicide). A device that blocks sperm (diaphragm). Having low levels of a female hormone (estrogen). Being pregnant. You are more likely to develop this condition if: You have genes that add to your risk. You are sexually active. You take antibiotic medicines. You have trouble peeing because of: A prostate that is bigger than normal, if you are female. A blockage in the part of your body that drains pee from the bladder. A kidney stone. A nerve condition that affects your bladder. Not getting enough to drink. Not peeing often enough. You have other conditions, such as: Diabetes. A weak disease-fighting system (immune system). Sickle cell disease. Gout. Injury of the spine. What are the signs or symptoms? Symptoms of this condition include: Needing to pee right away. Peeing small amounts often. Pain or burning when peeing. Blood in the pee. Pee that smells bad or not like normal. Trouble peeing. Pee that is cloudy. Fluid coming from the vagina, if you are female. Pain in the belly or lower back. Other symptoms include: Vomiting. Not feeling hungry. Feeling mixed up (confused). This may be the first symptom in  older adults. Being tired and grouchy (irritable). A fever. Watery poop (diarrhea). How is this treated? Taking antibiotic medicine. Taking other medicines. Drinking enough water. In some cases, you may need to see a specialist. Follow these instructions at home:  Medicines Take over-the-counter and prescription medicines only as told by your doctor. If you were prescribed an antibiotic medicine, take it as told by your doctor. Do not stop taking it even if you start to feel better. General instructions Make sure you: Pee until your bladder is empty. Do not hold pee for a long time. Empty your bladder after sex. Wipe from front to back after peeing or pooping if you are a female. Use each tissue one time when you wipe. Drink enough fluid to keep your pee pale yellow. Keep all follow-up visits. Contact a doctor if: You do not get better after 1-2 days. Your symptoms go away and then come back. Get help right away if: You have very bad back pain. You have very bad pain in your lower belly. You have a fever. You have chills. You feeling like you will vomit or you vomit. Summary A urinary tract infection (UTI) is an infection of any part of the urinary tract. This condition is caused by germs in your genital area. There are many risk factors for a UTI. Treatment includes antibiotic medicines. Drink enough fluid to keep your pee pale yellow. This information is not intended to replace advice given to you by your health care provider. Make sure you discuss any questions you have with your health care provider. Document Revised: 12/31/2019 Document Reviewed: 01/05/2020 Elsevier Patient Education    2024 Elsevier Inc.  

## 2023-05-20 DIAGNOSIS — H26493 Other secondary cataract, bilateral: Secondary | ICD-10-CM | POA: Diagnosis not present

## 2023-05-20 DIAGNOSIS — Z961 Presence of intraocular lens: Secondary | ICD-10-CM | POA: Diagnosis not present

## 2023-05-20 DIAGNOSIS — H26491 Other secondary cataract, right eye: Secondary | ICD-10-CM | POA: Diagnosis not present

## 2023-05-20 DIAGNOSIS — H18413 Arcus senilis, bilateral: Secondary | ICD-10-CM | POA: Diagnosis not present

## 2023-05-21 DIAGNOSIS — R350 Frequency of micturition: Secondary | ICD-10-CM | POA: Diagnosis not present

## 2023-05-27 ENCOUNTER — Encounter: Payer: Self-pay | Admitting: Physician Assistant

## 2023-05-27 ENCOUNTER — Ambulatory Visit: Payer: Medicare Other | Attending: Physician Assistant | Admitting: Physician Assistant

## 2023-05-27 VITALS — BP 170/70 | HR 60 | Ht 61.5 in | Wt 120.0 lb

## 2023-05-27 DIAGNOSIS — I739 Peripheral vascular disease, unspecified: Secondary | ICD-10-CM | POA: Diagnosis not present

## 2023-05-27 DIAGNOSIS — I10 Essential (primary) hypertension: Secondary | ICD-10-CM

## 2023-05-27 DIAGNOSIS — E782 Mixed hyperlipidemia: Secondary | ICD-10-CM

## 2023-05-27 DIAGNOSIS — H26493 Other secondary cataract, bilateral: Secondary | ICD-10-CM | POA: Diagnosis not present

## 2023-05-27 NOTE — Patient Instructions (Signed)
Medication Instructions:  NO CHANGES *If you need a refill on your cardiac medications before your next appointment, please call your pharmacy*   Lab Work: NO LABS If you have labs (blood work) drawn today and your tests are completely normal, you will receive your results only by: MyChart Message (if you have MyChart) OR A paper copy in the mail If you have any lab test that is abnormal or we need to change your treatment, we will call you to review the results.   Testing/Procedures:[address]- IN JUNE 2025 Your physician has requested that you have an ankle brachial index (ABI). During this test an ultrasound and blood pressure cuff are used to evaluate the arteries that supply the arms and legs with blood. Allow thirty minutes for this exam. There are no restrictions or special instructions.  Please note: We ask at that you not bring children with you during ultrasound (echo/ vascular) testing. Due to room size and safety concerns, children are not allowed in the ultrasound rooms during exams. Our front office staff cannot provide observation of children in our lobby area while testing is being conducted. An adult accompanying a patient to their appointment will only be allowed in the ultrasound room at the discretion of the ultrasound technician under special circumstances. We apologize for any inconvenience.    [address]- IN JUNE 2025 Your physician has requested that you have a lower extremity arterial exercise duplex. During this test, exercise and ultrasound are used to evaluate arterial blood flow in the legs. Allow one hour for this exam. There are no restrictions or special instructions.    Follow-Up: At [facility], you and your health needs are our priority.  As part of our continuing mission to provide you with exceptional heart care, we have created designated Provider Care Teams.  These Care Teams include your primary Cardiologist  (physician) and Advanced Practice Providers (APPs -  Physician Assistants and Nurse Practitioners) who all work together to provide you with the care you need, when you need it.    Your next appointment:   6 month(s)  Provider:   Nanetta Batty, MD

## 2023-05-27 NOTE — Progress Notes (Signed)
Cardiology Office Note:  .   Date:  05/29/2023  ID:  Diane Villa, DOB 1945-09-17, MRN [id number] PCP: Laurann Montana, MD  [facility] Providers Cardiologist:  Nanetta Batty, MD     History of Present Illness: .   Diane Villa is a 77 y.o. female with a hx of hypertension, hyperlipidemia and history of PAD.  Patient was initially referred to Dr. Allyson Sabal by Dr. Dellia Nims her podiatrist for right calf lifestyle-limiting claudication.  Doppler obtained in March 2021 revealed a right ABI of 0.55, left ABI of 1.03.  She underwent peripheral angioplasty on 09/25/2019 which revealed subtotally occluded short segment of right mid SFA with 1 vessel runoff bilaterally via peroneal artery.  She underwent Hawk one directional atherectomy followed by drug-coated balloon angioplasty.  Her claudication has since resolved.  Her calcium score in May 2021 was 5, which places the patient at 35th percentile for age and sex matched control.  Right ABI in November 2022 dropped from 1.04 down to 0.88.  However repeat ABI 26-month later showed right ABI improved to 0.98, although she does have high frequency signal in her distal SFA.  She was instructed to go back to atenolol.  Heart monitor obtained in June 2023 showed minimal heart rate of 45, maximal heart rate 162, average heart rate 56.  PVC burden 1.8%.  I last saw the patient in December 2023 at which time her palpitation was well-controlled on atenolol.  She has significant sciatica issue.  I suspect her symptom was more consistent with neurogenic rather than vascular pain.  Repeat ABI in June 2023 showed right ABI 0.95, left ABI 1.08.  50 to 74% stenosis in proximal right SFA, stable 50 to 99% stenosis in mid to distal SFA.    Patient presents today for follow-up.  She denies any recent chest pain or shortness of breath.  She has no claudication symptom.  Due to arthritis issue, she has not been walking as long as she used to.  She used to walk 2.5 miles per day,  now she only walk 1.5 miles per day.  She does go to the gym 3 times a week to ride stationary bicycle.  She has not experienced any leg pain.  On physical exam, she has no lower extremity edema.  She denies any recent orthopnea or PND.  She can follow-up with Dr. Allyson Sabal in 6 months, she should have her ABI and LEA in June 2025 prior to her next follow-up.   ROS:   She denies chest pain, palpitations, dyspnea, pnd, orthopnea, n, v, dizziness, syncope, edema, weight gain, or early satiety. All other systems reviewed and are otherwise negative except as noted above.    Studies Reviewed: Marland Kitchen   EKG Interpretation Date/Time:  Thursday May 27 2023 13:36:56 EST Ventricular Rate:  60 PR Interval:  140 QRS Duration:  82 QT Interval:  412 QTC Calculation: 412 R Axis:   53  Text Interpretation: Normal sinus rhythm Nonspecific ST abnormality When compared with ECG of 27-Nov-2022 15:04, No significant change was found Confirmed by Azalee Course [id number]) on 05/27/2023 1:49:05 PM    Cardiac Studies & Procedures        MONITORS  LONG TERM MONITOR (3-14 DAYS) 12/04/2021  Narrative Patch Wear Time:  14 days and 0 hours (2023-06-09T22:34:26-0400 to 2023-06-23T22:34:30-0400)  Patient had a min HR of 45 bpm, max HR of 162 bpm, and avg HR of 56 bpm. Predominant underlying rhythm was Sinus Rhythm. Bundle Branch Block/IVCD was present.  75 Supraventricular Tachycardia runs occurred, the run with the fastest interval lasting 5 beats with a max rate of 162 bpm, the longest lasting 18.7 secs with an avg rate of 110 bpm. Some episodes of Supraventricular Tachycardia may be possible Atrial Tachycardia with variable block. Isolated SVEs were occasional (1.8%, 19184), SVE Couplets were rare (<1.0%, 232), and SVE Triplets were rare (<1.0%, 63). Isolated VEs were rare (<1.0%, 482), VE Couplets were rare (<1.0%, 45), and VE Triplets were rare (<1.0%, 2).  1. SR/SB/ST 2. Freq PACs, occasional PVCs 3. Runs of SVT (18 sec  was longest run)  CT SCANS  CT CARDIAC SCORING (SELF PAY ONLY) 10/27/2019  Addendum 10/27/2019  4:54 PM ADDENDUM REPORT: 10/27/2019 16:52  CLINICAL DATA:  Risk stratification  EXAM: Coronary Calcium Score  TECHNIQUE: The patient was scanned on a Siemens Sensation 16 slice scanner. Axial non-contrast 3mm slices were carried out through the heart. The data set was analyzed on a dedicated work station and scored using the Agatson method.  FINDINGS: Non-cardiac: See separate report from [facility].  Ascending Aorta: Normal caliber  Pericardium: Normal  IMPRESSION: Coronary calcium score of 5 Agatston units. This was 35th percentile for age and sex matched control. This suggests low risk for future cardiac events.  [name]   Electronically Signed By: Marca Ancona M.D. On: 10/27/2019 16:52  Narrative EXAM: OVER-READ INTERPRETATION  CT CHEST  The following report is an over-read performed by radiologist Dr. Jeronimo Greaves of [facility] on 10/27/2019. This over-read does not include interpretation of cardiac or coronary anatomy or pathology. The calcium score interpretation by the cardiologist is attached.  COMPARISON:  None.  FINDINGS: Vascular: Aortic atherosclerosis.  Mediastinum/Nodes: No imaged thoracic adenopathy.  Lungs/Pleura: No pleural fluid.  Clear imaged lungs.  Upper Abdomen: Normal imaged portions of the liver, spleen, stomach.  Musculoskeletal: No acute osseous abnormality.  IMPRESSION: 1.  No acute findings in the imaged extracardiac chest. 2.  Aortic Atherosclerosis (ICD10-I70.0).  Electronically Signed: By: Jeronimo Greaves M.D. On: 10/27/2019 10:17          Risk Assessment/Calculations:            Physical Exam:   VS:  BP (!) 170/70   Pulse 60   Ht 5' 1.5" (1.562 m)   Wt 120 lb (54.4 kg)   SpO2 97%   BMI 22.31 kg/m    Wt Readings from Last 3 Encounters:  05/27/23 120 lb (54.4 kg)  04/19/23 115 lb  (52.2 kg)  03/17/23 114 lb (51.7 kg)    GEN: Well nourished, well developed in no acute distress NECK: No JVD; No carotid bruits CARDIAC: RRR, no murmurs, rubs, gallops RESPIRATORY:  Clear to auscultation without rales, wheezing or rhonchi  ABDOMEN: Soft, non-tender, non-distended EXTREMITIES:  No edema; No deformity   ASSESSMENT AND PLAN: .    Peripheral Arterial Disease (PAD) Stable moderate stenosis in the right SFA. No current claudication symptoms. ABI improved from 0.84 to 0.98. -Continue current exercise regimen. -Repeat ABI and lower extremity arterial Doppler in June 2025.  Hypertension Elevated blood pressure at today's visit, but patient reports usual home readings in the 120s. Currently on Atenolol, Amlodipine, and Valsartan. -Monitor blood pressure at home. If systolic blood pressure consistently above 140, notify clinic for possible medication adjustment.  Hyperlipidemia Well controlled on Crestor. Recent labs show total cholesterol 1.2, LDL 50, HDL 55, and triglycerides 88. -Continue Crestor.   Follow-up -Return to clinic in June 2025 after completion of ABI  and lower extremity arterial Doppler.       Dispo: Follow-up in 6 months after repeat ABI  Signed, Azalee Course, PA

## 2023-06-16 ENCOUNTER — Encounter: Payer: Self-pay | Admitting: Nurse Practitioner

## 2023-06-16 ENCOUNTER — Ambulatory Visit: Payer: Medicare Other | Admitting: Nurse Practitioner

## 2023-06-16 VITALS — BP 112/70 | HR 59 | Ht 61.0 in | Wt 118.1 lb

## 2023-06-16 DIAGNOSIS — I739 Peripheral vascular disease, unspecified: Secondary | ICD-10-CM | POA: Diagnosis not present

## 2023-06-16 DIAGNOSIS — Z1211 Encounter for screening for malignant neoplasm of colon: Secondary | ICD-10-CM

## 2023-06-16 DIAGNOSIS — I10 Essential (primary) hypertension: Secondary | ICD-10-CM | POA: Diagnosis not present

## 2023-06-16 NOTE — Patient Instructions (Signed)
 Follow up as needed.  _______________________________________________________  If your blood pressure at your visit was 140/90 or greater, please contact your primary care physician to follow up on this.  _______________________________________________________  If you are age 77 or older, your body mass index should be between 23-30. Your Body mass index is 22.32 kg/m. If this is out of the aforementioned range listed, please consider follow up with your Primary Care Provider.  If you are age 63 or younger, your body mass index should be between 19-25. Your Body mass index is 22.32 kg/m. If this is out of the aformentioned range listed, please consider follow up with your Primary Care Provider.   ________________________________________________________  The [place] [facility] providers would like to encourage you to use MYCHART to communicate with providers for non-urgent requests or questions.  Due to long hold times on the telephone, sending your provider a message by [facility] may be a faster and more efficient way to get a response.  Please allow 48 business hours for a response.  Please remember that this is for non-urgent requests.  _______________________________________________________    It was a pleasure to see you today!  Thank you for trusting me with your gastrointestinal care!     Vina Dasen, NP

## 2023-06-16 NOTE — Progress Notes (Signed)
 Brief Narrative 78 y.o. yo female known remotely to Dr. Avram with a past medical history not limited to HTN, PAD   ASSESSMENT    Colon cancer screening.  No alarm symptoms.    See PMH for any additional medical history   PLAN   -She seems to be in relatively good health and is willing to undergo another screening colonoscopy. However, she is also comfortable with discontinuation of screening colonoscopies given her age. At this point we will not arrange for a screening colonoscopy. She will contact us  for development of any symptoms such as bowel changes or blood in stool.   HPI   Chief complaint : None. Here to discuss screening colonoscopy    Virlee has no GI complaints.  Specifically, she has no blood in stool.  No abdominal pain.  No bowel changes except for constipation as a side effect of different medications from time to time. She has been having recurrent UTI's, followed by Urology. Takes Macrobid . She had lost some weight but gained about 5 pounds back recently.    GI History / Studies   **May not be a complete list of studies  July 2014 Screening colonoscopy  --Normal   Past Medical History:  Diagnosis Date   Anxiety    Chronic back pain    GERD (gastroesophageal reflux disease)    Hematuria    Hypercholesteremia    Hypertension    Mitral valve prolapse    Osteoarthritis    right great toe   Osteopenia    Vitamin D deficiency    Past Surgical History:  Procedure Laterality Date   ABDOMINAL AORTOGRAM W/LOWER EXTREMITY Bilateral 09/25/2019   Procedure: ABDOMINAL AORTOGRAM W/LOWER EXTREMITY;  Surgeon: Court Dorn PARAS, MD;  Location: [facility] INVASIVE [facility];  Service: Cardiovascular;  Laterality: Bilateral;   APPENDECTOMY     COLONOSCOPY     PERIPHERAL VASCULAR ATHERECTOMY Right 09/25/2019   Procedure: PERIPHERAL VASCULAR ATHERECTOMY;  Surgeon: Court Dorn PARAS, MD;  Location: [facility] INVASIVE CV LAB;  Service: Cardiovascular;  Laterality: Right;  SFA with  DCB   TUBAL LIGATION     Family History  Problem Relation Age of Onset   Diabetes Mother    Heart failure Mother    Other Father        killled in an accident   Heart disease Sister    Stroke Maternal Grandmother    Colon cancer Neg Hx    Breast cancer Neg Hx    Stomach cancer Neg Hx    Liver cancer Neg Hx    Social History   Tobacco Use   Smoking status: Never   Smokeless tobacco: Never  Vaping Use   Vaping status: Never Used  Substance Use Topics   Alcohol use: No   Drug use: No   Current Outpatient Medications  Medication Sig Dispense Refill   Acetaminophen  (TYLENOL  ARTHRITIS PAIN PO)      ALPRAZolam  (XANAX ) 0.5 MG tablet Take 0.25 mg by mouth 3 (three) times daily as needed for anxiety.   0   amLODipine  (NORVASC ) 2.5 MG tablet Take 2.5 mg by mouth at bedtime.      ASPIRIN  LOW DOSE 81 MG EC tablet TAKE 1 TABLET BY MOUTH EVERY DAY 30 tablet 5   atenolol  (TENORMIN ) 100 MG tablet Take 100 mg by mouth at bedtime.      cholecalciferol (VITAMIN D) 1000 UNITS tablet Take 1,000 Units by mouth daily.     clobetasol ointment (TEMOVATE) 0.05 %  SMARTSIG:Sparingly Topical Daily PRN     Coenzyme Q10 (CO Q-10) 200 MG CAPS See admin instructions.     COVID-19 mRNA bivalent vaccine, Pfizer, (PFIZER COVID-19 VAC BIVALENT) injection Inject into the muscle. 0.3 mL 0   COVID-19 mRNA vaccine 2023-2024 (COMIRNATY ) syringe Inject into the muscle. 0.3 mL 0   nitrofurantoin  (MACRODANTIN ) 100 MG capsule Take 100 mg by mouth daily.     Propylene Glycol (SYSTANE COMPLETE) 0.6 % SOLN Place 1 drop into both eyes 3 (three) times daily as needed (dry/irritated eyes.).     rosuvastatin (CRESTOR) 10 MG tablet Take 10 mg by mouth daily.     RSV vaccine recomb adjuvanted (AREXVY ) 120 MCG/0.5ML injection Inject into the muscle. 0.5 mL 0   valsartan (DIOVAN) 320 MG tablet 1 tablet     No current facility-administered medications for this visit.   Allergies  Allergen Reactions   Ambien [Zolpidem  Tartrate]    Atorvastatin      Other reaction(s): elevated liver tests   Denosumab      Other reaction(s): consipation   Elemental Sulfur     RASH ON ABDOMEN   Zolpidem     Other reaction(s): too strong   Sulfamethoxazole Rash     Review of Systems: All other systems reviewed and negative except where noted in HPI.   Wt Readings from Last 3 Encounters:  05/27/23 120 lb (54.4 kg)  04/19/23 115 lb (52.2 kg)  03/17/23 114 lb (51.7 kg)    Physical Exam:  BP 112/70   Pulse (!) 59   Ht 5' 1 (1.549 m)   Wt 118 lb 2 oz (53.6 kg)   SpO2 97%   BMI 22.32 kg/m  Constitutional:  Pleasant, generally well appearing female in no acute distress. Psychiatric:  Normal mood and affect. Behavior is normal. EENT: Pupils normal.  Conjunctivae are normal. No scleral icterus. Neck supple.  Cardiovascular: Normal rate, regular rhythm.  Pulmonary/chest: Effort normal and breath sounds normal. No wheezing, rales or rhonchi. Abdominal: Soft, nondistended, nontender. Bowel sounds active throughout. There are no masses palpable. No hepatomegaly. Neurological: Alert and oriented to person place and time.   Vina Dasen, NP  06/16/2023, 2:12 PM  Cc:  Referring Provider Teresa Channel, MD

## 2023-06-17 ENCOUNTER — Telehealth: Payer: Self-pay | Admitting: *Deleted

## 2023-06-17 DIAGNOSIS — H26492 Other secondary cataract, left eye: Secondary | ICD-10-CM | POA: Diagnosis not present

## 2023-06-17 NOTE — Telephone Encounter (Signed)
-----   Message from Vina Dasen sent at 06/17/2023  1:52 PM EST ----- Merlynn,  Please call patient and let her know that Dr. Avram is fine with her stopping screening colonoscopy based her her age. She should let us  know if she develops any concerning symptoms like rectal bleeding, bowel changes, etc  Thanks

## 2023-06-17 NOTE — Telephone Encounter (Signed)
 Called patient to inform her that Dr. Avram is fine with her not continuing screening for colonoscopy due to her age. She should let us  know if she develops any concerning symptoms such as rectal bleeding and bowel changes. Left message for patient to return call to [facility] at [phone].

## 2023-06-18 NOTE — Telephone Encounter (Signed)
 Patient returned call, informed patient that Dr. Avram is fine with her not continuing screening for colonoscopy due to her age. She should let us  know if she develops any concerning symptoms such as rectal bleeding and bowel changes. Patient understood and agreed.

## 2023-06-18 NOTE — Telephone Encounter (Signed)
 Patient returned call, requesting to speak with nurse. Please advise.

## 2023-06-24 DIAGNOSIS — H2512 Age-related nuclear cataract, left eye: Secondary | ICD-10-CM | POA: Diagnosis not present

## 2023-07-09 DIAGNOSIS — R350 Frequency of micturition: Secondary | ICD-10-CM | POA: Diagnosis not present

## 2023-07-09 DIAGNOSIS — N390 Urinary tract infection, site not specified: Secondary | ICD-10-CM | POA: Diagnosis not present

## 2023-08-31 DIAGNOSIS — N390 Urinary tract infection, site not specified: Secondary | ICD-10-CM | POA: Diagnosis not present

## 2023-08-31 DIAGNOSIS — E785 Hyperlipidemia, unspecified: Secondary | ICD-10-CM | POA: Diagnosis not present

## 2023-08-31 DIAGNOSIS — I10 Essential (primary) hypertension: Secondary | ICD-10-CM | POA: Diagnosis not present

## 2023-08-31 DIAGNOSIS — M79671 Pain in right foot: Secondary | ICD-10-CM | POA: Diagnosis not present

## 2023-10-13 ENCOUNTER — Ambulatory Visit: Admitting: Podiatry

## 2023-10-13 DIAGNOSIS — L600 Ingrowing nail: Secondary | ICD-10-CM

## 2023-10-13 NOTE — Progress Notes (Signed)
 Subjective:  Patient ID: Diane Villa, female    DOB: 1945-08-04,  MRN: [id number]  Chief Complaint  Patient presents with   Ingrown Toenail    Right hallux ingrown     78 y.o. female presents with the above complaint.  Patient presents with right hallux medial border ingrown painful to touch is progressing and worse worse with ambulation is with pressure patient would like to have removed has not seen arise prior to seeing me denies any other acute complaints pain scale 7 out of 10 dull aching nature   Review of Systems: Negative except as noted in the HPI. Denies N/V/F/Ch.  Past Medical History:  Diagnosis Date   Anxiety    Chronic back pain    Chronic UTI    GERD (gastroesophageal reflux disease)    Hematuria    Hypercholesteremia    Hypertension    Mitral valve prolapse    Osteoarthritis    right great toe   Osteopenia    Vitamin D deficiency     Current Outpatient Medications:    Acetaminophen  (TYLENOL  ARTHRITIS PAIN PO), , Disp: , Rfl:    ALPRAZolam  (XANAX ) 0.5 MG tablet, Take 0.25 mg by mouth 3 (three) times daily as needed for anxiety.  (Patient not taking: Reported on 06/16/2023), Disp: , Rfl: 0   amLODipine  (NORVASC ) 2.5 MG tablet, Take 2.5 mg by mouth at bedtime. , Disp: , Rfl:    ASPIRIN  LOW DOSE 81 MG EC tablet, TAKE 1 TABLET BY MOUTH EVERY DAY, Disp: 30 tablet, Rfl: 5   atenolol  (TENORMIN ) 100 MG tablet, Take 100 mg by mouth at bedtime. , Disp: , Rfl:    cholecalciferol (VITAMIN D) 1000 UNITS tablet, Take 1,000 Units by mouth daily., Disp: , Rfl:    Coenzyme Q10 (CO Q-10) 200 MG CAPS, See admin instructions., Disp: , Rfl:    COVID-19 mRNA bivalent vaccine, Pfizer, (PFIZER COVID-19 VAC BIVALENT) injection, Inject into the muscle. (Patient not taking: Reported on 06/16/2023), Disp: 0.3 mL, Rfl: 0   COVID-19 mRNA vaccine 2023-2024 (COMIRNATY ) syringe, Inject into the muscle. (Patient not taking: Reported on 06/16/2023), Disp: 0.3 mL, Rfl: 0   estradiol (ESTRACE) 0.1  MG/GM vaginal cream, Place 1 Applicatorful vaginally 2 (two) times a week., Disp: , Rfl:    nitrofurantoin  (MACRODANTIN ) 100 MG capsule, Take 100 mg by mouth daily., Disp: , Rfl:    rosuvastatin (CRESTOR) 10 MG tablet, Take 10 mg by mouth daily., Disp: , Rfl:    RSV vaccine recomb adjuvanted (AREXVY ) 120 MCG/0.5ML injection, Inject into the muscle. (Patient not taking: Reported on 06/16/2023), Disp: 0.5 mL, Rfl: 0   valsartan (DIOVAN) 320 MG tablet, 1 tablet, Disp: , Rfl:   Social History   Tobacco Use  Smoking Status Never  Smokeless Tobacco Never    Allergies  Allergen Reactions   Ambien [Zolpidem Tartrate]    Atorvastatin      Other reaction(s): elevated liver tests   Denosumab      Other reaction(s): consipation   Elemental Sulfur     RASH ON ABDOMEN   Zolpidem     Other reaction(s): too strong   Sulfamethoxazole Rash   Objective:  There were no vitals filed for this visit. There is no height or weight on file to calculate BMI. Constitutional Well developed. Well nourished.  Vascular Dorsalis pedis pulses palpable bilaterally. Posterior tibial pulses palpable bilaterally. Capillary refill normal to all digits.  No cyanosis or clubbing noted. Pedal hair growth normal.  Neurologic Normal speech. Oriented to  person, place, and time. Epicritic sensation to light touch grossly present bilaterally.  Dermatologic Painful ingrowing nail at medial nail borders of the hallux nail right. No other open wounds. No skin lesions.  Orthopedic: Normal joint ROM without pain or crepitus bilaterally. No visible deformities. No bony tenderness.   Radiographs: None Assessment:  No diagnosis found. Plan:  Patient was evaluated and treated and all questions answered.  Ingrown Nail, right -Patient elects to proceed with minor surgery to remove ingrown toenail removal today. Consent reviewed and signed by patient. -Ingrown nail excised. See procedure note. -Educated on post-procedure  care including soaking. Written instructions provided and reviewed. -Patient to follow up in 2 weeks for nail check.  Procedure: Excision of Ingrown Toenail Location: Right 1st toe medial nail borders. Anesthesia: Lidocaine  1% plain; 1.5 mL and Marcaine 0.5% plain; 1.5 mL, digital block. Skin Prep: Betadine. Dressing: Silvadene; telfa; dry, sterile, compression dressing. Technique: Following skin prep, the toe was exsanguinated and a tourniquet was secured at the base of the toe. The affected nail border was freed, split with a nail splitter, and excised. Chemical matrixectomy was then performed with phenol and irrigated out with alcohol. The tourniquet was then removed and sterile dressing applied. Disposition: Patient tolerated procedure well. Patient to return in 2 weeks for follow-up.   No follow-ups on file.

## 2023-10-20 DIAGNOSIS — R350 Frequency of micturition: Secondary | ICD-10-CM | POA: Diagnosis not present

## 2023-11-10 ENCOUNTER — Ambulatory Visit: Admitting: Podiatry

## 2023-11-10 DIAGNOSIS — M722 Plantar fascial fibromatosis: Secondary | ICD-10-CM

## 2023-11-10 DIAGNOSIS — M62461 Contracture of muscle, right lower leg: Secondary | ICD-10-CM

## 2023-11-10 NOTE — Progress Notes (Signed)
 Subjective:  Patient ID: Diane Villa, female    DOB: 12-03-1945,  MRN: [id number]  Chief Complaint  Patient presents with   Ingrown Toenail    Follow up from ingrown procedure     78 y.o. female presents with the above complaint.  Patient presents with complaint of right plantar fasciitis pain pain in the middle of the foot.  Hurts with ambulation pain scale 7 out of 10 dull aching nature.  Denies any other acute complaints.  Would like to discuss treatment options for this.  The ingrown is doing good.   Review of Systems: Negative except as noted in the HPI. Denies N/V/F/Ch.  Past Medical History:  Diagnosis Date   Anxiety    Chronic back pain    Chronic UTI    GERD (gastroesophageal reflux disease)    Hematuria    Hypercholesteremia    Hypertension    Mitral valve prolapse    Osteoarthritis    right great toe   Osteopenia    Vitamin D deficiency     Current Outpatient Medications:    clobetasol ointment (TEMOVATE) 0.05 %, SMARTSIG:sparingly Topical Daily PRN, Disp: , Rfl:    Acetaminophen  (TYLENOL  ARTHRITIS PAIN PO), , Disp: , Rfl:    ALPRAZolam  (XANAX ) 0.5 MG tablet, Take 0.25 mg by mouth 3 (three) times daily as needed for anxiety.  (Patient not taking: Reported on 06/16/2023), Disp: , Rfl: 0   amLODipine  (NORVASC ) 2.5 MG tablet, Take 2.5 mg by mouth at bedtime. , Disp: , Rfl:    ASPIRIN  LOW DOSE 81 MG EC tablet, TAKE 1 TABLET BY MOUTH EVERY DAY, Disp: 30 tablet, Rfl: 5   atenolol  (TENORMIN ) 100 MG tablet, Take 100 mg by mouth at bedtime. , Disp: , Rfl:    cholecalciferol (VITAMIN D) 1000 UNITS tablet, Take 1,000 Units by mouth daily., Disp: , Rfl:    Coenzyme Q10 (CO Q-10) 200 MG CAPS, See admin instructions., Disp: , Rfl:    COVID-19 mRNA bivalent vaccine, Pfizer, (PFIZER COVID-19 VAC BIVALENT) injection, Inject into the muscle. (Patient not taking: Reported on 06/16/2023), Disp: 0.3 mL, Rfl: 0   COVID-19 mRNA vaccine 2023-2024 (COMIRNATY ) syringe, Inject into the  muscle. (Patient not taking: Reported on 06/16/2023), Disp: 0.3 mL, Rfl: 0   estradiol (ESTRACE) 0.1 MG/GM vaginal cream, Place 1 Applicatorful vaginally 2 (two) times a week., Disp: , Rfl:    nitrofurantoin  (MACRODANTIN ) 100 MG capsule, Take 100 mg by mouth daily., Disp: , Rfl:    rosuvastatin (CRESTOR) 10 MG tablet, Take 10 mg by mouth daily., Disp: , Rfl:    RSV vaccine recomb adjuvanted (AREXVY ) 120 MCG/0.5ML injection, Inject into the muscle. (Patient not taking: Reported on 06/16/2023), Disp: 0.5 mL, Rfl: 0   valsartan (DIOVAN) 320 MG tablet, 1 tablet, Disp: , Rfl:   Social History   Tobacco Use  Smoking Status Never  Smokeless Tobacco Never    Allergies  Allergen Reactions   Ambien [Zolpidem Tartrate]    Atorvastatin      Other reaction(s): elevated liver tests   Denosumab      Other reaction(s): consipation   Elemental Sulfur     RASH ON ABDOMEN   Zolpidem     Other reaction(s): too strong   Sulfamethoxazole Rash   Objective:  There were no vitals filed for this visit. There is no height or weight on file to calculate BMI. Constitutional Well developed. Well nourished.  Vascular Dorsalis pedis pulses palpable bilaterally. Posterior tibial pulses palpable bilaterally. Capillary refill normal to  all digits.  No cyanosis or clubbing noted. Pedal hair growth normal.  Neurologic Normal speech. Oriented to person, place, and time. Epicritic sensation to light touch grossly present bilaterally.  Dermatologic Nails well groomed and normal in appearance. No open wounds. No skin lesions.  Orthopedic: Normal joint ROM without pain or crepitus bilaterally. No visible deformities. Tender to palpation at the calcaneal tuber right. No pain with calcaneal squeeze right. Ankle ROM diminished range of motion right. Silfverskiold Test: positive right.   Radiographs: None  Assessment:   1. Plantar fasciitis of right foot   2. Gastrocnemius equinus of right lower extremity     Plan:  Patient was evaluated and treated and all questions answered.  Plantar Fasciitis, right with gastrocnemius recession  - XR reviewed as above.  - Educated on icing and stretching. Instructions given.  - Injection delivered to the plantar fascia as below. - DME: Plantar fascial brace dispensed to support the medial longitudinal arch of the foot and offload pressure from the heel and prevent arch collapse during weightbearing - Pharmacologic management: None  Procedure: Injection Tendon/Ligament Location: Right plantar fascia at the glabrous junction; medial approach. Skin Prep: alcohol Injectate: 0.5 cc 0.5% marcaine plain, 0.5 cc of 1% Lidocaine , 0.5 cc kenalog  10. Disposition: Patient tolerated procedure well. Injection site dressed with a band-aid.  No follow-ups on file.

## 2023-11-16 ENCOUNTER — Ambulatory Visit (HOSPITAL_COMMUNITY)
Admission: RE | Admit: 2023-11-16 | Discharge: 2023-11-16 | Disposition: A | Discharge status: Home / Self Care | Payer: Medicare Other | Source: Ambulatory Visit | Attending: Cardiovascular Disease | Admitting: Cardiovascular Disease

## 2023-11-16 ENCOUNTER — Ambulatory Visit: Payer: Self-pay | Admitting: Cardiovascular Disease

## 2023-11-16 DIAGNOSIS — I739 Peripheral vascular disease, unspecified: Secondary | ICD-10-CM

## 2023-11-16 LAB — VAS US ABI WITH/WO TBI
Left ABI: 0.97
Right ABI: 0.85

## 2023-11-17 ENCOUNTER — Other Ambulatory Visit: Payer: Self-pay | Admitting: *Deleted

## 2023-11-17 DIAGNOSIS — I739 Peripheral vascular disease, unspecified: Secondary | ICD-10-CM

## 2023-11-23 ENCOUNTER — Ambulatory Visit: Payer: Medicare Other | Attending: Cardiovascular Disease | Admitting: Cardiovascular Disease

## 2023-11-23 ENCOUNTER — Encounter: Payer: Self-pay | Admitting: Cardiovascular Disease

## 2023-11-23 VITALS — BP 190/90 | HR 66 | Ht 61.0 in | Wt 115.0 lb

## 2023-11-23 DIAGNOSIS — E782 Mixed hyperlipidemia: Secondary | ICD-10-CM | POA: Diagnosis not present

## 2023-11-23 DIAGNOSIS — I739 Peripheral vascular disease, unspecified: Secondary | ICD-10-CM

## 2023-11-23 DIAGNOSIS — R002 Palpitations: Secondary | ICD-10-CM | POA: Diagnosis not present

## 2023-11-23 DIAGNOSIS — I10 Essential (primary) hypertension: Secondary | ICD-10-CM

## 2023-11-23 NOTE — Assessment & Plan Note (Signed)
 History of palpitations with monitor performed 11/12/2021 that showed frequent PACs, occasional PVCs and short runs of SVT.  She is on atenolol  and these seem to have significantly improved.

## 2023-11-23 NOTE — Progress Notes (Signed)
 11/23/2023 Diane Villa   1945/12/29  [id number]  Primary Physician Victorio Grave, MD Primary Cardiologist: Avanell Leigh MD Bennye Bravo, [place]  HPI:  Diane Villa is a 78 y.o.    thin and fit appearing widowed Caucasian female mother of 2 children, grandmother of 1 grandchild who was referred to me by Dr. Celia Coles, her podiatrist for right calf lifestyle limiting claudication.  I last saw her in the office 11/27/2022.Aaron Aas She is a retired Management consultant.  Her only cardiovascular risk factors are treated hypertension and untreated hyperlipidemia.  There is no family history.  She is never had a heart attack or stroke.  She denies chest pain or shortness of breath.  She exercises on a daily basis 70 to 100 minutes a day including playing tennis.  She is noted right calf claudication over the last several months which is lifestyle limiting.  Dopplers performed 09/05/2019 revealed a right ankle-brachial index of 0.55 and a left of 1.03 with an occluded right SFA.     I performed peripheral angiography on her 09/25/2019 revealing subtotally occluded short segment pre-CTO mid right SFA with one-vessel runoff bilaterally via the peroneal artery.  I performed [facility] 1 directional atherectomy followed by drug-coated balloon angioplasty with excellent angiographic result.  Her claudication has resolved.  Her Dopplers have normalized.  She did have some ecchymosis in her left groin and suprapubic pubic area which tracked down the medial aspect of her left medial thigh and has since disappeared.     She has some right knee osteoarthritis with pain in her knee and foot although she denies claudication.  She has lost 20 pounds as result of diet and exercise since I last saw her.  Her lipid profile is excellent with an LDL in the 50 range.  I did get a coronary calcium  score score and her blood 10/27/2019 which was 5.  She had recent Doppler studies that showed a high-frequency signal in her distal  right SFA on 04/18/2021 although she denies claudication.  Her right ABI dropped from 1.04 down to 0.88.  Since I saw her in the office a year ago she continues to do well.  She does have arthritis in her hip and knees but she denies claudication.  Her palpitations are improved.  She denies chest pain or shortness of breath.  Her Dopplers have remained stable over the last several years.   Current Meds  Medication Sig   Acetaminophen  (TYLENOL  ARTHRITIS PAIN PO)    ALPRAZolam  (XANAX ) 0.5 MG tablet Take 0.25 mg by mouth 3 (three) times daily as needed for anxiety.    amLODipine  (NORVASC ) 2.5 MG tablet Take 2.5 mg by mouth at bedtime.    ASPIRIN  LOW DOSE 81 MG EC tablet TAKE 1 TABLET BY MOUTH EVERY DAY   atenolol  (TENORMIN ) 100 MG tablet Take 100 mg by mouth at bedtime.    cholecalciferol (VITAMIN D) 1000 UNITS tablet Take 1,000 Units by mouth daily.   clobetasol ointment (TEMOVATE) 0.05 % SMARTSIG:sparingly Topical Daily PRN   Coenzyme Q10 (CO Q-10) 200 MG CAPS See admin instructions.   COVID-19 mRNA bivalent vaccine, Pfizer, (PFIZER COVID-19 VAC BIVALENT) injection Inject into the muscle.   COVID-19 mRNA vaccine 2023-2024 (COMIRNATY ) syringe Inject into the muscle.   estradiol (ESTRACE) 0.1 MG/GM vaginal cream Place 1 Applicatorful vaginally 2 (two) times a week.   nitrofurantoin  (MACRODANTIN ) 100 MG capsule Take 100 mg by mouth daily.   rosuvastatin (CRESTOR) 10 MG  tablet Take 10 mg by mouth daily.   RSV vaccine recomb adjuvanted (AREXVY ) 120 MCG/0.5ML injection Inject into the muscle.   valsartan (DIOVAN) 320 MG tablet 1 tablet     Allergies  Allergen Reactions   Ambien [Zolpidem Tartrate]    Atorvastatin      Other reaction(s): elevated liver tests   Denosumab      Other reaction(s): consipation   Elemental Sulfur     RASH ON ABDOMEN   Zolpidem     Other reaction(s): too strong   Sulfamethoxazole Rash    Social History   Socioeconomic History   Marital status: Widowed     Spouse name: Not on file   Number of children: 2   Years of education: Not on file   Highest education level: Not on file  Occupational History   Occupation: retired    Comment: accounts payable  Tobacco Use   Smoking status: Never   Smokeless tobacco: Never  Vaping Use   Vaping status: Never Used  Substance and Sexual Activity   Alcohol use: No   Drug use: No   Sexual activity: Not on file  Other Topics Concern   Not on file  Social History Narrative   Not on file   Social Drivers of Health   Financial Resource Strain: Not on file  Food Insecurity: Not on file  Transportation Needs: Not on file  Physical Activity: Not on file  Stress: Not on file  Social Connections: Not on file  Intimate Partner Violence: Not on file     Review of Systems: General: negative for chills, fever, night sweats or weight changes.  Cardiovascular: negative for chest pain, dyspnea on exertion, edema, orthopnea, palpitations, paroxysmal nocturnal dyspnea or shortness of breath Dermatological: negative for rash Respiratory: negative for cough or wheezing Urologic: negative for hematuria Abdominal: negative for nausea, vomiting, diarrhea, bright red blood per rectum, melena, or hematemesis Neurologic: negative for visual changes, syncope, or dizziness All other systems reviewed and are otherwise negative except as noted above.    Blood pressure (!) 190/90, pulse 66, height 5' 1 (1.549 m), weight 115 lb (52.2 kg), SpO2 96%.  General appearance: alert and no distress Neck: no adenopathy, no carotid bruit, no JVD, supple, symmetrical, trachea midline, and thyroid  not enlarged, symmetric, no tenderness/mass/nodules Lungs: clear to auscultation bilaterally Heart: regular rate and rhythm, S1, S2 normal, no murmur, click, rub or gallop Extremities: extremities normal, atraumatic, no cyanosis or edema Pulses: 2+ and symmetric Skin: Skin color, texture, turgor normal. No rashes or  lesions Neurologic: Grossly normal  EKG EKG Interpretation Date/Time:  Tuesday November 23 2023 13:32:46 EDT Ventricular Rate:  67 PR Interval:  136 QRS Duration:  80 QT Interval:  400 QTC Calculation: 422 R Axis:   40  Text Interpretation: Normal sinus rhythm Possible Left atrial enlargement Nonspecific ST abnormality When compared with ECG of 27-May-2023 13:36, No significant change was found Confirmed by Lauro Portal [phone]) on 11/23/2023 1:39:26 PM    ASSESSMENT AND PLAN:   Peripheral arterial disease (HCC) History of PAD status post right SFA directional atherectomy followed by DCB/19/21 for lifestyle-limiting claudication.  She did have one-vessel runoff via peroneal.  Her Dopplers improved after that and her claudication resolved.  The last 2 years her Dopplers did remain fairly stable with high-frequency signals in the right SFA but no claudication.  Will continue to monitor on an annual basis.  Essential hypertension History of essential hypertension with blood pressure measured today at 190/90.  She says  she took her blood pressure this morning before she came and it was 124/72.  I did review her blood pressure readings at home which are all within normal range.  She also on amlodipine , atenolol  and valsartan.  Hyperlipidemia History of hyperlipidemia on statin therapy with lipid profile performed 02/26/2023 revealing total cholesterol of 122, LDL 50 and HDL 55.  Palpitations History of palpitations with monitor performed 11/12/2021 that showed frequent PACs, occasional PVCs and short runs of SVT.  She is on atenolol  and these seem to have significantly improved.     Avanell Leigh MD FACP,FACC,FAHA, [facility] 11/23/2023 1:51 PM

## 2023-11-23 NOTE — Patient Instructions (Signed)
 Medication Instructions:  Your physician recommends that you continue on your current medications as directed. Please refer to the Current Medication list given to you today.  *If you need a refill on your cardiac medications before your next appointment, please call your pharmacy*   Testing/Procedures: Your physician has requested that you have a lower extremity arterial duplex. During this test, ultrasound is used to evaluate arterial blood flow in the legs. Allow one hour for this exam. There are no restrictions or special instructions. This will take place at [address] 4th floor  **To do in June 2026**  Please note: We ask at that you not bring children with you during ultrasound (echo/ vascular) testing. Due to room size and safety concerns, children are not allowed in the ultrasound rooms during exams. Our front office staff cannot provide observation of children in our lobby area while testing is being conducted. An adult accompanying a patient to their appointment will only be allowed in the ultrasound room at the discretion of the ultrasound technician under special circumstances. We apologize for any inconvenience.  Your physician has requested that you have an ankle brachial index (ABI). During this test an ultrasound and blood pressure cuff are used to evaluate the arteries that supply the arms and legs with blood. Allow thirty minutes for this exam. There are no restrictions or special instructions. This will take place at [address] 4th floor   **To do in June 2026**   Please note: We ask at that you not bring children with you during ultrasound (echo/ vascular) testing. Due to room size and safety concerns, children are not allowed in the ultrasound rooms during exams. Our front office staff cannot provide observation of children in our lobby area while testing is being conducted. An adult accompanying a patient to their appointment will only be allowed in the ultrasound  room at the discretion of the ultrasound technician under special circumstances. We apologize for any inconvenience.   Follow-Up: At [facility], you and your health needs are our priority.  As part of our continuing mission to provide you with exceptional heart care, our providers are all part of one team.  This team includes your primary Cardiologist (physician) and Advanced Practice Providers or APPs (Physician Assistants and Nurse Practitioners) who all work together to provide you with the care you need, when you need it.  Your next appointment:   12 month(s)  Provider:   Lauro Portal, MD    We recommend signing up for the patient portal called MyChart.  Sign up information is provided on this After Visit Summary.  MyChart is used to connect with patients for Virtual Visits (Telemedicine).  Patients are able to view lab/test results, encounter notes, upcoming appointments, etc.  Non-urgent messages can be sent to your provider as well.   To learn more about what you can do with MyChart, go to [email or web address].

## 2023-11-23 NOTE — Assessment & Plan Note (Signed)
 History of essential hypertension with blood pressure measured today at 190/90.  She says she took her blood pressure this morning before she came and it was 124/72.  I did review her blood pressure readings at home which are all within normal range.  She also on amlodipine , atenolol  and valsartan.

## 2023-11-23 NOTE — Assessment & Plan Note (Signed)
 History of hyperlipidemia on statin therapy with lipid profile performed 02/26/2023 revealing total cholesterol of 122, LDL 50 and HDL 55.

## 2023-11-23 NOTE — Assessment & Plan Note (Signed)
 History of PAD status post right SFA directional atherectomy followed by DCB/19/21 for lifestyle-limiting claudication.  She did have one-vessel runoff via peroneal.  Her Dopplers improved after that and her claudication resolved.  The last 2 years her Dopplers did remain fairly stable with high-frequency signals in the right SFA but no claudication.  Will continue to monitor on an annual basis.

## 2023-12-06 DIAGNOSIS — M810 Age-related osteoporosis without current pathological fracture: Secondary | ICD-10-CM | POA: Diagnosis not present

## 2023-12-06 DIAGNOSIS — M1711 Unilateral primary osteoarthritis, right knee: Secondary | ICD-10-CM | POA: Diagnosis not present

## 2023-12-06 DIAGNOSIS — E785 Hyperlipidemia, unspecified: Secondary | ICD-10-CM | POA: Diagnosis not present

## 2023-12-06 DIAGNOSIS — I10 Essential (primary) hypertension: Secondary | ICD-10-CM | POA: Diagnosis not present

## 2023-12-09 ENCOUNTER — Other Ambulatory Visit: Payer: Self-pay | Admitting: Family Medicine

## 2023-12-09 DIAGNOSIS — Z1231 Encounter for screening mammogram for malignant neoplasm of breast: Secondary | ICD-10-CM

## 2024-01-05 ENCOUNTER — Ambulatory Visit (HOSPITAL_BASED_OUTPATIENT_CLINIC_OR_DEPARTMENT_OTHER): Admitting: Orthopaedic Surgery

## 2024-01-05 ENCOUNTER — Other Ambulatory Visit (HOSPITAL_BASED_OUTPATIENT_CLINIC_OR_DEPARTMENT_OTHER): Payer: Self-pay

## 2024-01-05 ENCOUNTER — Ambulatory Visit (HOSPITAL_BASED_OUTPATIENT_CLINIC_OR_DEPARTMENT_OTHER)

## 2024-01-05 DIAGNOSIS — M19079 Primary osteoarthritis, unspecified ankle and foot: Secondary | ICD-10-CM | POA: Diagnosis not present

## 2024-01-05 DIAGNOSIS — M25551 Pain in right hip: Secondary | ICD-10-CM | POA: Diagnosis not present

## 2024-01-05 DIAGNOSIS — M160 Bilateral primary osteoarthritis of hip: Secondary | ICD-10-CM | POA: Diagnosis not present

## 2024-01-05 DIAGNOSIS — M47816 Spondylosis without myelopathy or radiculopathy, lumbar region: Secondary | ICD-10-CM | POA: Diagnosis not present

## 2024-01-05 NOTE — Progress Notes (Signed)
 Chief Complaint: Right hip pain     History of Present Illness:   01/05/2024: Diane Villa presents today for follow-up of her right leg pain.  Overall she did get good relief from her injection.  At today's visit her great toe is her predominant pain  Diane Villa is a 78 y.o. female presents today with right hip pain which has been ongoing and consistent for multiple years.  She does have a history of wrist arthritis that ultimately required pinning on September 12 and as result within a boot following this.  She has subsequently had some flareups of her Knee and back/hip.  She states that the pain can occasionally be very bad and limit her ability to walk or be more active.  She is here today for further assessment.  She has not previously had any injections or physical therapy    Surgical History:   None  PMH/PSH/Family History/Social History/Meds/Allergies:    Past Medical History:  Diagnosis Date   Anxiety    Chronic back pain    Chronic UTI    GERD (gastroesophageal reflux disease)    Hematuria    Hypercholesteremia    Hypertension    Mitral valve prolapse    Osteoarthritis    right great toe   Osteopenia    Vitamin D deficiency    Past Surgical History:  Procedure Laterality Date   ABDOMINAL AORTOGRAM W/LOWER EXTREMITY Bilateral 09/25/2019   Procedure: ABDOMINAL AORTOGRAM W/LOWER EXTREMITY;  Surgeon: Court Dorn PARAS, MD;  Location: [facility];  Service: Cardiovascular;  Laterality: Bilateral;   APPENDECTOMY     COLONOSCOPY     KNEE SURGERY Right    PERIPHERAL VASCULAR ATHERECTOMY Right 09/25/2019   Procedure: PERIPHERAL VASCULAR ATHERECTOMY;  Surgeon: Court Dorn PARAS, MD;  Location: [facility];  Service: Cardiovascular;  Laterality: Right;  SFA with DCB   TUBAL LIGATION     Social History   Socioeconomic History   Marital status: Widowed    Spouse name: Not on file   Number of children: 2   Years of education:  Not on file   Highest education level: Not on file  Occupational History   Occupation: retired    Comment: accounts payable  Tobacco Use   Smoking status: Never   Smokeless tobacco: Never  Vaping Use   Vaping status: Never Used  Substance and Sexual Activity   Alcohol use: No   Drug use: No   Sexual activity: Not on file  Other Topics Concern   Not on file  Social History Narrative   Not on file   Social Drivers of Health   Financial Resource Strain: Not on file  Food Insecurity: Not on file  Transportation Needs: Not on file  Physical Activity: Not on file  Stress: Not on file  Social Connections: Not on file   Family History  Problem Relation Age of Onset   Diabetes Mother    Heart failure Mother    Other Father        killled in an accident   Heart disease Sister    Stroke Maternal Grandmother    Colon cancer Neg Hx    Breast cancer Neg Hx    Stomach cancer Neg Hx    Liver cancer Neg Hx    Allergies  Allergen  Reactions   Ambien [Zolpidem Tartrate]    Atorvastatin      Other reaction(s): elevated liver tests   Denosumab      Other reaction(s): consipation   Elemental Sulfur     RASH ON ABDOMEN   Zolpidem     Other reaction(s): too strong   Sulfamethoxazole Rash   Current Outpatient Medications  Medication Sig Dispense Refill   Acetaminophen  (TYLENOL  ARTHRITIS PAIN PO)      ALPRAZolam  (XANAX ) 0.5 MG tablet Take 0.25 mg by mouth 3 (three) times daily as needed for anxiety.   0   amLODipine  (NORVASC ) 2.5 MG tablet Take 2.5 mg by mouth at bedtime.      ASPIRIN  LOW DOSE 81 MG EC tablet TAKE 1 TABLET BY MOUTH EVERY DAY 30 tablet 5   atenolol  (TENORMIN ) 100 MG tablet Take 100 mg by mouth at bedtime.      cholecalciferol (VITAMIN D) 1000 UNITS tablet Take 1,000 Units by mouth daily.     clobetasol ointment (TEMOVATE) 0.05 % SMARTSIG:sparingly Topical Daily PRN     Coenzyme Q10 (CO Q-10) 200 MG CAPS See admin instructions.     COVID-19 mRNA bivalent vaccine,  Pfizer, (PFIZER COVID-19 VAC BIVALENT) injection Inject into the muscle. 0.3 mL 0   COVID-19 mRNA vaccine 2023-2024 (COMIRNATY ) syringe Inject into the muscle. 0.3 mL 0   estradiol (ESTRACE) 0.1 MG/GM vaginal cream Place 1 Applicatorful vaginally 2 (two) times a week.     nitrofurantoin  (MACRODANTIN ) 100 MG capsule Take 100 mg by mouth daily.     rosuvastatin (CRESTOR) 10 MG tablet Take 10 mg by mouth daily.     RSV vaccine recomb adjuvanted (AREXVY ) 120 MCG/0.5ML injection Inject into the muscle. 0.5 mL 0   valsartan (DIOVAN) 320 MG tablet 1 tablet     No current facility-administered medications for this visit.   No results found.  Review of Systems:   A ROS was performed including pertinent positives and negatives as documented in the HPI.  Physical Exam :   Constitutional: NAD and appears stated age Neurological: Alert and oriented Psych: Appropriate affect and cooperative There were no vitals taken for this visit.   Comprehensive Musculoskeletal Exam:    Tenderness to palpation about the groin and back with 100 degrees of flexion and 20 degrees of internal rotation.  There is approximately 20 degrees of external rotation.  She is unable to go to the figure-of-four position compared to the contralateral side.  Significant hallux rigidus with tenderness and pain over the first MTP  Imaging:   Xray (4 views right hip, right foot 3 views): Early osteoarthritic findings involving the right femoral acetabular joint.  Status post pinning of the right first distal phalanx with severe MTP arthritis  I personally reviewed and interpreted the radiographs.   Assessment:   78 y.o. female right lower leg pain which has been relatively difficult to diagnose.  On further discussion with her today, she did have a right first distal metatarsal osteotomy performed and she states that all of these issues have cleared up since that.  She does note that she is walking on the outside of the foot.   On review of her radiographs today I do believe that she has quite severe first MTP osteoarthritis and likely this is changing her gait pattern and causing more proximal base pain.  To that effect I do believe that she would benefit from a referral to Dr. Harden for discussion of first MTP fusion.  Will plan for referral  to him today Plan :    -Plan for referral to Dr. Harden for consideration of right first MTP fusion       I personally saw and evaluated the patient, and participated in the management and treatment plan.  Elspeth Parker, MD Attending Physician, Orthopedic Surgery  This document was dictated using Dragon voice recognition software. A reasonable attempt at proof reading has been made to minimize errors.

## 2024-01-06 DIAGNOSIS — E785 Hyperlipidemia, unspecified: Secondary | ICD-10-CM | POA: Diagnosis not present

## 2024-01-06 DIAGNOSIS — M1711 Unilateral primary osteoarthritis, right knee: Secondary | ICD-10-CM | POA: Diagnosis not present

## 2024-01-06 DIAGNOSIS — M810 Age-related osteoporosis without current pathological fracture: Secondary | ICD-10-CM | POA: Diagnosis not present

## 2024-01-06 DIAGNOSIS — I10 Essential (primary) hypertension: Secondary | ICD-10-CM | POA: Diagnosis not present

## 2024-01-24 ENCOUNTER — Ambulatory Visit
Admission: RE | Admit: 2024-01-24 | Discharge: 2024-01-24 | Disposition: A | Discharge status: Home / Self Care | Source: Ambulatory Visit | Attending: Family Medicine | Admitting: Family Medicine

## 2024-01-24 DIAGNOSIS — Z1231 Encounter for screening mammogram for malignant neoplasm of breast: Secondary | ICD-10-CM

## 2024-02-06 DIAGNOSIS — M810 Age-related osteoporosis without current pathological fracture: Secondary | ICD-10-CM | POA: Diagnosis not present

## 2024-02-06 DIAGNOSIS — M1711 Unilateral primary osteoarthritis, right knee: Secondary | ICD-10-CM | POA: Diagnosis not present

## 2024-02-06 DIAGNOSIS — E785 Hyperlipidemia, unspecified: Secondary | ICD-10-CM | POA: Diagnosis not present

## 2024-02-06 DIAGNOSIS — I10 Essential (primary) hypertension: Secondary | ICD-10-CM | POA: Diagnosis not present

## 2024-02-14 ENCOUNTER — Other Ambulatory Visit (HOSPITAL_BASED_OUTPATIENT_CLINIC_OR_DEPARTMENT_OTHER): Payer: Self-pay

## 2024-02-14 MED ORDER — FLUZONE HIGH-DOSE 0.5 ML IM SUSY
0.5000 mL | PREFILLED_SYRINGE | Freq: Once | INTRAMUSCULAR | 0 refills | Status: AC
  Filled 2024-02-14: qty 0.5, 1d supply, fill #0

## 2024-02-22 DIAGNOSIS — R350 Frequency of micturition: Secondary | ICD-10-CM | POA: Diagnosis not present

## 2024-02-23 ENCOUNTER — Other Ambulatory Visit (HOSPITAL_BASED_OUTPATIENT_CLINIC_OR_DEPARTMENT_OTHER): Payer: Self-pay

## 2024-02-23 MED ORDER — COMIRNATY 30 MCG/0.3ML IM SUSY
0.3000 mL | PREFILLED_SYRINGE | Freq: Once | INTRAMUSCULAR | 0 refills | Status: AC
  Filled 2024-02-23: qty 0.3, 1d supply, fill #0

## 2024-03-07 DIAGNOSIS — M810 Age-related osteoporosis without current pathological fracture: Secondary | ICD-10-CM | POA: Diagnosis not present

## 2024-03-07 DIAGNOSIS — E785 Hyperlipidemia, unspecified: Secondary | ICD-10-CM | POA: Diagnosis not present

## 2024-03-07 DIAGNOSIS — M1711 Unilateral primary osteoarthritis, right knee: Secondary | ICD-10-CM | POA: Diagnosis not present

## 2024-03-07 DIAGNOSIS — I10 Essential (primary) hypertension: Secondary | ICD-10-CM | POA: Diagnosis not present

## 2024-03-08 DIAGNOSIS — M810 Age-related osteoporosis without current pathological fracture: Secondary | ICD-10-CM | POA: Diagnosis not present

## 2024-03-08 DIAGNOSIS — N390 Urinary tract infection, site not specified: Secondary | ICD-10-CM | POA: Diagnosis not present

## 2024-03-08 DIAGNOSIS — D692 Other nonthrombocytopenic purpura: Secondary | ICD-10-CM | POA: Diagnosis not present

## 2024-03-08 DIAGNOSIS — E559 Vitamin D deficiency, unspecified: Secondary | ICD-10-CM | POA: Diagnosis not present

## 2024-03-08 DIAGNOSIS — I739 Peripheral vascular disease, unspecified: Secondary | ICD-10-CM | POA: Diagnosis not present

## 2024-03-08 DIAGNOSIS — Z23 Encounter for immunization: Secondary | ICD-10-CM | POA: Diagnosis not present

## 2024-03-08 DIAGNOSIS — I10 Essential (primary) hypertension: Secondary | ICD-10-CM | POA: Diagnosis not present

## 2024-03-08 DIAGNOSIS — E785 Hyperlipidemia, unspecified: Secondary | ICD-10-CM | POA: Diagnosis not present

## 2024-03-08 DIAGNOSIS — L729 Follicular cyst of the skin and subcutaneous tissue, unspecified: Secondary | ICD-10-CM | POA: Diagnosis not present

## 2024-03-08 DIAGNOSIS — Z0000 Encounter for general adult medical examination without abnormal findings: Secondary | ICD-10-CM | POA: Diagnosis not present

## 2024-03-12 ENCOUNTER — Other Ambulatory Visit (HOSPITAL_BASED_OUTPATIENT_CLINIC_OR_DEPARTMENT_OTHER): Payer: Self-pay | Admitting: Family Medicine

## 2024-03-12 DIAGNOSIS — M810 Age-related osteoporosis without current pathological fracture: Secondary | ICD-10-CM

## 2024-04-10 ENCOUNTER — Encounter: Payer: Self-pay | Admitting: Radiology

## 2024-07-06 ENCOUNTER — Telehealth: Payer: Self-pay | Admitting: Cardiovascular Disease

## 2024-07-06 NOTE — Telephone Encounter (Signed)
 Will defer to Dr. Court to see if she needs to be seen early, she was previously seen every 6 month. Last visit with Dr. Court was in June 2025, it was mentioned at the time that she has not significant claudication symptom and her lower extremity doppler has been stable for 2 years, she can follow on a yearly basis.

## 2024-07-06 NOTE — Telephone Encounter (Signed)
 Pt states that in the past she had been seeing Scot in Dec and Dr Court in June. She states she will do what Dr Court feels is necessary, but she wanted to make sure that she wasn't supposed to see Scot in between and if so she missed this Dec. Pt mentioned that she thought she was part of a [facility] program that required seeing the APP. Pt denies any health concerns at the present. Will send to Dr Court and Hao for advisement.

## 2024-07-06 NOTE — Telephone Encounter (Signed)
 Patient is requesting a call back, she states she usually comes in every 6 months and wanted to verify if she needed to come in december of 2025.

## 2024-07-07 NOTE — Telephone Encounter (Signed)
 Left detailed message per DPR relaying information from both Hao and Dr Court.

## 2024-11-16 ENCOUNTER — Encounter (HOSPITAL_COMMUNITY)

## 2024-11-22 ENCOUNTER — Ambulatory Visit: Admitting: Cardiovascular Disease

## 2024-11-22 ENCOUNTER — Encounter (HOSPITAL_COMMUNITY)
